# Patient Record
Sex: Female | Born: 1960 | Race: White | Hispanic: No | Marital: Married | Smoking: Never smoker
Health system: Southern US, Community
[De-identification: ages and names within clinical notes are randomized; demographics above are authoritative.]

## PROBLEM LIST (undated history)

## (undated) DIAGNOSIS — J45909 Unspecified asthma, uncomplicated: Secondary | ICD-10-CM

## (undated) DIAGNOSIS — R251 Tremor, unspecified: Secondary | ICD-10-CM

## (undated) DIAGNOSIS — L570 Actinic keratosis: Secondary | ICD-10-CM

## (undated) HISTORY — PX: BILATERAL OOPHORECTOMY: SHX1221

## (undated) HISTORY — DX: Unspecified asthma, uncomplicated: J45.909

## (undated) HISTORY — PX: OOPHORECTOMY: SHX86

## (undated) HISTORY — DX: Actinic keratosis: L57.0

---

## 1995-11-04 DIAGNOSIS — D239 Other benign neoplasm of skin, unspecified: Secondary | ICD-10-CM

## 1995-11-04 HISTORY — DX: Other benign neoplasm of skin, unspecified: D23.9

## 1999-11-29 ENCOUNTER — Other Ambulatory Visit: Admission: RE | Admit: 1999-11-29 | Discharge: 1999-11-29 | Payer: Self-pay | Admitting: Obstetrics and Gynecology

## 2001-02-25 ENCOUNTER — Other Ambulatory Visit: Admission: RE | Admit: 2001-02-25 | Discharge: 2001-02-25 | Payer: Self-pay | Admitting: Obstetrics and Gynecology

## 2002-03-18 ENCOUNTER — Other Ambulatory Visit: Admission: RE | Admit: 2002-03-18 | Discharge: 2002-03-18 | Payer: Self-pay | Admitting: Obstetrics and Gynecology

## 2003-03-21 ENCOUNTER — Other Ambulatory Visit: Admission: RE | Admit: 2003-03-21 | Discharge: 2003-03-21 | Payer: Self-pay | Admitting: Obstetrics and Gynecology

## 2004-11-05 ENCOUNTER — Ambulatory Visit: Payer: Self-pay | Admitting: Unknown Physician Specialty

## 2004-12-26 ENCOUNTER — Ambulatory Visit: Payer: Self-pay

## 2005-07-23 ENCOUNTER — Ambulatory Visit: Payer: Self-pay | Admitting: Obstetrics and Gynecology

## 2005-12-30 HISTORY — PX: ABDOMINAL HYSTERECTOMY: SHX81

## 2008-12-30 LAB — HM COLONOSCOPY

## 2009-05-31 ENCOUNTER — Ambulatory Visit: Payer: Self-pay | Admitting: Unknown Physician Specialty

## 2011-12-31 LAB — HM PAP SMEAR

## 2012-08-10 ENCOUNTER — Ambulatory Visit: Payer: Self-pay | Admitting: Internal Medicine

## 2012-08-10 DIAGNOSIS — Z0289 Encounter for other administrative examinations: Secondary | ICD-10-CM

## 2012-09-16 ENCOUNTER — Encounter: Payer: Self-pay | Admitting: Internal Medicine

## 2012-09-16 ENCOUNTER — Ambulatory Visit (INDEPENDENT_AMBULATORY_CARE_PROVIDER_SITE_OTHER): Payer: BC Managed Care – PPO | Admitting: Internal Medicine

## 2012-09-16 VITALS — BP 120/64 | HR 68 | Temp 98.4°F | Resp 16 | Ht 69.5 in | Wt 146.0 lb

## 2012-09-16 DIAGNOSIS — G4700 Insomnia, unspecified: Secondary | ICD-10-CM

## 2012-09-16 DIAGNOSIS — M25551 Pain in right hip: Secondary | ICD-10-CM

## 2012-09-16 DIAGNOSIS — J45909 Unspecified asthma, uncomplicated: Secondary | ICD-10-CM

## 2012-09-16 DIAGNOSIS — M25559 Pain in unspecified hip: Secondary | ICD-10-CM

## 2012-09-16 NOTE — Patient Instructions (Addendum)
I will let you know when I received Dr. Marily Lente records

## 2012-09-16 NOTE — Progress Notes (Signed)
Patient ID: Carol Harvey, female   DOB: 22-Sep-1961, 51 y.o.   MRN: [id number] Patient Active Problem List  Diagnosis  . Insomnia  . Asthma  . Hip pain, right    Subjective:  CC:   Chief Complaint  Patient presents with  . Establish Care    HPI:   Carol Harvey is a 51 y.o. female who presents as a new patient to establish primary care with the chief complaint of  Right hip pain.   Exercise induced injury which occurring from a streuous  sprinting exercise.  Her pain has been aggravated by getting in and out of a low riding sports car.  Prior orthopedic evaluation Drenda Freeze Alucio, [facility]) has diagnosed a torn labrum with bone spur. He has referred her to [facility] for a  for a  right hip arthroscopy.  2) Weight problem.  She has been working out with a trainer for the last 3 years and is dissatisfied with physical shape, feels she has lost muscle tone and has become flabby since her hysterectomy.  She has a well rounded diet and has increased water intake  and cut back on sweet tea and diet coke.   3)  Insomnia  Managed by Dr. Alanson Aly  who has tried multiple medications ,  Now managed with fluvoxamine .    Past Medical History  Diagnosis Date  . Allergic rhinitis   . Asthma     Past Surgical History  Procedure Date  . Abdominal hysterectomy 2007    for dysfunctional uterine bleeding   . Bilateral oophorectomy     Family History  Problem Relation Age of Onset  . Cancer Mother     colon  . Hyperlipidemia Father   . Hypertension Father     History   Social History  . Marital Status: Single    Spouse Name: N/A    Number of Children: N/A  . Years of Education: N/A   Occupational History  . Not on file.   Social History Main Topics  . Smoking status: Never Smoker   . Smokeless tobacco: Never Used  . Alcohol Use: No  . Drug Use: No  . Sexually Active: Not on file   Other Topics Concern  . Not on file   Social History Narrative  . No  narrative on file         @ALLHX @    Review of Systems:   The remainder of the review of systems was negative except those addressed in the HPI.       Objective:  BP 120/64  Pulse 68  Temp 98.4 F (36.9 C) (Oral)  Resp 16  Ht 5' 9.5" (1.765 m)  Wt 146 lb (66.225 kg)  BMI 21.25 kg/m2  General appearance: alert, cooperative and appears stated age Ears: normal TM's and external ear canals both ears Throat: lips, mucosa, and tongue normal; teeth and gums normal Neck: no adenopathy, no carotid bruit, supple, symmetrical, trachea midline and thyroid not enlarged, symmetric, no tenderness/mass/nodules Back: symmetric, no curvature. ROM normal. No CVA tenderness. Lungs: clear to auscultation bilaterally Heart: regular rate and rhythm, S1, S2 normal, no murmur, click, rub or gallop Abdomen: soft, non-tender; bowel sounds normal; no masses,  no organomegaly Pulses: 2+ and symmetric Skin: Skin color, texture, turgor normal. No rashes or lesions Lymph nodes: Cervical, supraclavicular, and axillary nodes normal.  Assessment and Plan:  Hip pain, right Awaiting arthroscopic surgery for torn labrum and bone spur. Her pain is tolerable without  narcotics if she avoids aggressive workouts.  Asthma With rare exacerbations.  Continue antihistamins and leuokotrine inhibitor.   Insomnia Managed by Dr. Jennelle Human with fluvoxamine.,  Patient averages 6 to 8 hours per night and is functioning well during the day despite episodes of early morning waking which she manages by  Working in her home office until sleepy again.    Updated Medication List Outpatient Encounter Prescriptions as of 09/16/2012  Medication Sig Dispense Refill  . fexofenadine (ALLEGRA) 180 MG tablet Take 180 mg by mouth daily.      . montelukast (SINGULAIR) 10 MG tablet Take 10 mg by mouth at bedtime.         Orders Placed This Encounter  Procedures  . HM MAMMOGRAPHY  . HM PAP SMEAR  . HM COLONOSCOPY    No  Follow-up on file.

## 2012-09-18 ENCOUNTER — Encounter: Payer: Self-pay | Admitting: Internal Medicine

## 2012-09-18 DIAGNOSIS — J45909 Unspecified asthma, uncomplicated: Secondary | ICD-10-CM | POA: Insufficient documentation

## 2012-09-18 DIAGNOSIS — M25551 Pain in right hip: Secondary | ICD-10-CM | POA: Insufficient documentation

## 2012-09-18 NOTE — Assessment & Plan Note (Addendum)
Managed by Dr. Jennelle Human with fluvoxamine.,  Patient averages 6 to 8 hours per night and is functioning well during the day despite episodes of early morning waking which she manages by  Working in her home office until sleepy again.

## 2012-09-18 NOTE — Assessment & Plan Note (Signed)
Awaiting arthroscopic surgery for torn labrum and bone spur. Her pain is tolerable without narcotics if she avoids aggressive workouts.

## 2012-09-18 NOTE — Assessment & Plan Note (Signed)
With rare exacerbations.  Continue antihistamins and leuokotrine inhibitor.

## 2012-12-19 ENCOUNTER — Encounter (HOSPITAL_COMMUNITY): Payer: Self-pay | Admitting: Emergency Medicine

## 2012-12-19 ENCOUNTER — Emergency Department (HOSPITAL_COMMUNITY): Payer: BC Managed Care – PPO

## 2012-12-19 ENCOUNTER — Emergency Department (HOSPITAL_COMMUNITY)
Admission: EM | Admit: 2012-12-19 | Discharge: 2012-12-19 | Disposition: A | Discharge status: Home / Self Care | Payer: BC Managed Care – PPO | Attending: Emergency Medicine | Admitting: Emergency Medicine

## 2012-12-19 DIAGNOSIS — M79609 Pain in unspecified limb: Secondary | ICD-10-CM | POA: Insufficient documentation

## 2012-12-19 DIAGNOSIS — Z79899 Other long term (current) drug therapy: Secondary | ICD-10-CM | POA: Insufficient documentation

## 2012-12-19 DIAGNOSIS — M79676 Pain in unspecified toe(s): Secondary | ICD-10-CM

## 2012-12-19 DIAGNOSIS — J45909 Unspecified asthma, uncomplicated: Secondary | ICD-10-CM | POA: Insufficient documentation

## 2012-12-19 DIAGNOSIS — L0390 Cellulitis, unspecified: Secondary | ICD-10-CM

## 2012-12-19 DIAGNOSIS — J309 Allergic rhinitis, unspecified: Secondary | ICD-10-CM | POA: Insufficient documentation

## 2012-12-19 DIAGNOSIS — L03039 Cellulitis of unspecified toe: Secondary | ICD-10-CM | POA: Insufficient documentation

## 2012-12-19 DIAGNOSIS — L02619 Cutaneous abscess of unspecified foot: Secondary | ICD-10-CM | POA: Insufficient documentation

## 2012-12-19 MED ORDER — CEPHALEXIN 500 MG PO CAPS: 500.0000 mg | ORAL_CAPSULE | Freq: Four times a day (QID) | ORAL | Status: DC

## 2012-12-19 MED ORDER — CEPHALEXIN 500 MG PO CAPS
500.0000 mg | ORAL_CAPSULE | Freq: Once | ORAL | Status: AC
  Administered 2012-12-19: 500 mg via ORAL
  Filled 2012-12-19: qty 1

## 2012-12-19 NOTE — ED Notes (Signed)
Patient given discharge instructions, information, prescriptions, and diet order. Patient states that they adequately understand discharge information given and to return to ED if symptoms return or worsen.     

## 2012-12-19 NOTE — ED Provider Notes (Signed)
History     CSN: [id number]  Arrival date & time 12/19/12  9147   First MD Initiated Contact with Patient 12/19/12 2007      Chief Complaint  Patient presents with  . Toe Pain    (Consider location/radiation/quality/duration/timing/severity/associated sxs/prior treatment) HPI Comments: Patient presents with a chief complaint of right great toe pain.  She has also noticed mild erythema, warmth, and swelling of the toe.  Pain has been present for the past two days and is gradually worsening.    She had surgery on her toe with screws and plates performed by Dr. Lestine Box on 10/30/12.  She has not had any post op complications.  She is currently going to physical therapy.  She denies fever or chills.  No change in ROM of the toe.  She has been taking Ibuprofen and Tylenol for the pain, which helps somewhat.   The history is provided by the patient.    Past Medical History  Diagnosis Date  . Allergic rhinitis   . Asthma     Past Surgical History  Procedure Date  . Abdominal hysterectomy 2007    for dysfunctional uterine bleeding   . Bilateral oophorectomy     Family History  Problem Relation Age of Onset  . Cancer Mother     colon  . Hyperlipidemia Father   . Hypertension Father     History  Substance Use Topics  . Smoking status: Never Smoker   . Smokeless tobacco: Never Used  . Alcohol Use: No    OB History    Grav Para Term Preterm Abortions TAB SAB Ect Mult Living                  Review of Systems  Constitutional: Negative for fever and chills.  Musculoskeletal:       Right great toe pain  Skin: Positive for color change.  All other systems reviewed and are negative.    Allergies  Codeine and Minocycline  Home Medications   Current Outpatient Rx  Name  Route  Sig  Dispense  Refill  . ACETAMINOPHEN 500 MG PO TABS   Oral   Take 1,000 mg by mouth every 6 (six) hours as needed. pain         . FEXOFENADINE HCL 180 MG PO TABS   Oral   Take 180 mg  by mouth at bedtime.          . IBUPROFEN 200 MG PO TABS   Oral   Take 800 mg by mouth every 6 (six) hours as needed. pain         . MONTELUKAST SODIUM 10 MG PO TABS   Oral   Take 10 mg by mouth at bedtime.         . ADULT MULTIVITAMIN W/MINERALS CH   Oral   Take 1 tablet by mouth.         Marland Kitchen FISH OIL CONCENTRATE PO   Oral   Take 1 capsule by mouth daily.           BP 113/70  Pulse 75  Temp 98.4 F (36.9 C) (Oral)  Resp 14  Ht 5\' 10"  (1.778 m)  Wt 140 lb (63.504 kg)  BMI 20.09 kg/m2  SpO2 100%  Physical Exam  Nursing note and vitals reviewed. Constitutional: She appears well-developed and well-nourished. No distress.  HENT:  Head: Normocephalic and atraumatic.  Mouth/Throat: Oropharynx is clear and moist.  Cardiovascular: Normal rate, regular rhythm and normal heart  sounds.   Pulses:      Dorsalis pedis pulses are 2+ on the right side, and 2+ on the left side.  Pulmonary/Chest: Effort normal and breath sounds normal.  Musculoskeletal:       Full ROM of the MTP of the right great toe  Neurological: She is alert. No sensory deficit.  Skin: She is not diaphoretic.       Mild erythema, swelling, and warmth of the dorsal aspect of the right great toe  Psychiatric: She has a normal mood and affect.    ED Course  Procedures (including critical care time)  Labs Reviewed - No data to display Dg Foot Complete Right  12/19/2012  *RADIOLOGY REPORT*  Clinical Data: Toe pain, orthopedic surgery to the great toe 10/30/2012  RIGHT FOOT COMPLETE - 3+ VIEW  Comparison: None.  Findings: Side plate and screw fixation of comminuted proximal phalangeal fractures of the great toe noted.  No evidence for hardware failure.  Fracture lines remain visible.  Remainder of the osseous structures are unremarkable. Minimal associated callus formation.  IMPRESSION: Fracture lines remain visible at the site of previous hardware fixation of the proximal phalanx right great toe.   Original  Report Authenticated By: Christiana Pellant, M.D.      No diagnosis found.    MDM  Xray showing no evidence of hardware failure.  Slight erythema and warmth of the great toe.  Patient is afebrile with full ROM of the MTP.  Patient therefore treated for Cellulitis with antibiotic.  Return precautions discussed with the patient.        Pascal Lux [place], PA-C 12/21/12 1241

## 2012-12-19 NOTE — ED Notes (Signed)
Pt present's to the ED with a complaint of toe pain .  Pt recently had orthopedic surgery to the right big toe and has now noticed a bump on the medial side of the toe.  Patient states she has 3 out of 10 pain.  Pt states she is on her pain management regiment at this time.  Pt  States [facility] orthopedic on call doctor told her to come in.

## 2012-12-21 NOTE — ED Provider Notes (Signed)
Medical screening examination/treatment/procedure(s) were performed by non-physician practitioner and as supervising physician I was immediately available for consultation/collaboration.  Geoffery Lyons, MD 12/21/12 2240

## 2013-02-13 ENCOUNTER — Other Ambulatory Visit: Payer: Self-pay

## 2013-04-23 DIAGNOSIS — N951 Menopausal and female climacteric states: Secondary | ICD-10-CM | POA: Insufficient documentation

## 2013-11-04 ENCOUNTER — Other Ambulatory Visit: Payer: Self-pay

## 2014-10-14 ENCOUNTER — Other Ambulatory Visit: Payer: Self-pay

## 2015-01-16 ENCOUNTER — Ambulatory Visit: Payer: Self-pay | Admitting: Unknown Physician Specialty

## 2015-01-17 DIAGNOSIS — K635 Polyp of colon: Secondary | ICD-10-CM | POA: Insufficient documentation

## 2015-04-24 LAB — SURGICAL PATHOLOGY

## 2016-08-28 ENCOUNTER — Other Ambulatory Visit: Payer: Self-pay | Admitting: Family Medicine

## 2016-08-28 DIAGNOSIS — Z1231 Encounter for screening mammogram for malignant neoplasm of breast: Secondary | ICD-10-CM

## 2016-09-10 ENCOUNTER — Ambulatory Visit: Payer: Self-pay

## 2016-09-12 ENCOUNTER — Ambulatory Visit
Admission: RE | Admit: 2016-09-12 | Discharge: 2016-09-12 | Disposition: A | Discharge status: Home / Self Care | Payer: 59 | Source: Ambulatory Visit | Attending: Family Medicine | Admitting: Family Medicine

## 2016-09-12 DIAGNOSIS — Z1231 Encounter for screening mammogram for malignant neoplasm of breast: Secondary | ICD-10-CM | POA: Insufficient documentation

## 2016-09-13 ENCOUNTER — Other Ambulatory Visit: Payer: Self-pay | Admitting: *Deleted

## 2016-09-13 ENCOUNTER — Inpatient Hospital Stay
Admission: RE | Admit: 2016-09-13 | Discharge: 2016-09-13 | Disposition: A | Discharge status: Home / Self Care | Payer: Self-pay | Source: Ambulatory Visit | Attending: *Deleted | Admitting: *Deleted

## 2016-09-13 DIAGNOSIS — Z9289 Personal history of other medical treatment: Secondary | ICD-10-CM

## 2016-09-16 ENCOUNTER — Ambulatory Visit
Admission: RE | Admit: 2016-09-16 | Discharge: 2016-09-16 | Disposition: A | Discharge status: Home / Self Care | Payer: 59 | Source: Ambulatory Visit | Attending: Unknown Physician Specialty | Admitting: Unknown Physician Specialty

## 2016-09-16 ENCOUNTER — Other Ambulatory Visit: Payer: Self-pay | Admitting: Unknown Physician Specialty

## 2016-09-16 DIAGNOSIS — R05 Cough: Secondary | ICD-10-CM | POA: Insufficient documentation

## 2016-09-16 DIAGNOSIS — R059 Cough, unspecified: Secondary | ICD-10-CM

## 2017-02-14 ENCOUNTER — Emergency Department
Admission: EM | Admit: 2017-02-14 | Discharge: 2017-02-15 | Disposition: A | Discharge status: Home / Self Care | Payer: 59 | Attending: Emergency Medicine | Admitting: Emergency Medicine

## 2017-02-14 ENCOUNTER — Emergency Department: Payer: 59

## 2017-02-14 DIAGNOSIS — J45909 Unspecified asthma, uncomplicated: Secondary | ICD-10-CM | POA: Insufficient documentation

## 2017-02-14 DIAGNOSIS — R0989 Other specified symptoms and signs involving the circulatory and respiratory systems: Secondary | ICD-10-CM | POA: Insufficient documentation

## 2017-02-14 DIAGNOSIS — Z79899 Other long term (current) drug therapy: Secondary | ICD-10-CM | POA: Insufficient documentation

## 2017-02-14 DIAGNOSIS — R55 Syncope and collapse: Secondary | ICD-10-CM | POA: Diagnosis present

## 2017-02-14 DIAGNOSIS — T17308A Unspecified foreign body in larynx causing other injury, initial encounter: Secondary | ICD-10-CM

## 2017-02-14 HISTORY — DX: Tremor, unspecified: R25.1

## 2017-02-14 NOTE — ED Notes (Signed)
Patient reports she receives Botox injections in her neck - last inject 2  Months ago. PT reports she has had problems swallowing recently, she believes due to Botox and benign essential tremor

## 2017-02-14 NOTE — ED Triage Notes (Signed)
Patient choked on piece of steak approx 20 minutes ago. Pt lost consciousness, fell to floor. Pt c/o epigastric pain

## 2017-02-14 NOTE — ED Provider Notes (Signed)
[facility] Emergency Department Provider Note   First MD Initiated Contact with Patient 02/14/17 2315     (approximate)  I have reviewed the triage vital signs and the nursing notes.   HISTORY  Chief Complaint Choking and Loss of Consciousness   HPI Carol Harvey is a 56 y.o. female presents via EMS after choking while eating steak tonight. Per EMS the patient lost consciousness and Heimlich maneuver was performed by her friend. Per the patient's friend at bedside patient was ashen before she performed the Heimlich maneuver. Following doing so patient regained consciousness and appropriate skin color. Patient admits to pain at xiphoid process of the sternum. Of possible important patient states that she receives Botox injections in her neck secondary to benign essential tremors with last treatment 2 months ago. Patient states for approximately one month she is noted that she's had to "clear her throat" repetitively.   Past Medical History:  Diagnosis Date  . Allergic rhinitis   . Asthma   . Tremor     Patient Active Problem List   Diagnosis Date Noted  . Hip pain, right 09/18/2012  . Asthma   . Insomnia 09/16/2012    Past Surgical History:  Procedure Laterality Date  . ABDOMINAL HYSTERECTOMY  2007   for dysfunctional uterine bleeding   . BILATERAL OOPHORECTOMY      Prior to Admission medications   Medication Sig Start Date End Date Taking? Authorizing Provider  acetaminophen (TYLENOL) 500 MG tablet Take 1,000 mg by mouth every 6 (six) hours as needed. pain    Historical Provider, MD  cephALEXin (KEFLEX) 500 MG capsule Take 1 capsule (500 mg total) by mouth 4 (four) times daily. 12/19/12   Heather Laisure, PA-C  fexofenadine (ALLEGRA) 180 MG tablet Take 180 mg by mouth at bedtime.     Historical Provider, MD  ibuprofen (ADVIL,MOTRIN) 200 MG tablet Take 800 mg by mouth every 6 (six) hours as needed. pain    Historical Provider, MD    montelukast (SINGULAIR) 10 MG tablet Take 10 mg by mouth at bedtime.    Historical Provider, MD  Multiple Vitamin (MULTIVITAMIN WITH MINERALS) TABS Take 1 tablet by mouth.    Historical Provider, MD  Omega-3 Fatty Acids (FISH OIL CONCENTRATE PO) Take 1 capsule by mouth daily.    Historical Provider, MD    Allergies Codeine; Minocycline; and Wellbutrin [bupropion]  Family History  Problem Relation Age of Onset  . Cancer Mother     colon  . Hyperlipidemia Father   . Hypertension Father     Social History Social History  Substance Use Topics  . Smoking status: Never Smoker  . Smokeless tobacco: Never Used  . Alcohol use No     Comment: occasional wine    Review of Systems Constitutional: No fever/chills Eyes: No visual changes. ENT: No sore throat. Cardiovascular: Denies chest pain. Respiratory: Denies shortness of breath. Gastrointestinal: No abdominal pain.  No nausea, no vomiting.  No diarrhea.  No constipation. Genitourinary: Negative for dysuria. Musculoskeletal: Negative for back pain. Skin: Negative for rash. Neurological: Negative for headaches, focal weakness or numbness.  10-point ROS otherwise negative.  ____________________________________________   PHYSICAL EXAM:  VITAL SIGNS: ED Triage Vitals  Enc Vitals Group     BP 02/14/17 2309 (!) 138/95     Pulse Rate 02/14/17 2309 87     Resp 02/14/17 2309 (!) 21     Temp 02/14/17 2309 97.6 F (36.4 C)     Temp  Source 02/14/17 2309 Oral     SpO2 02/14/17 2307 96 %     Weight 02/14/17 2310 142 lb (64.4 kg)     Height 02/14/17 2310 5\' 11"  (1.803 m)     Head Circumference --      Peak Flow --      Pain Score 02/14/17 2310 5     Pain Loc --      Pain Edu? --      Excl. in [place]? --     Constitutional: Alert and oriented. Well appearing and in no acute distress. Eyes: Conjunctivae are normal. PERRL. EOMI. Head: Atraumatic. Ears:  Healthy appearing ear canals and TMs bilaterally Nose: No  congestion/rhinnorhea. Mouth/Throat: Mucous membranes are moist.  Oropharynx non-erythematous. Neck: No stridor.  No meningeal signs.   Cardiovascular: Normal rate, regular rhythm. Good peripheral circulation. Grossly normal heart sounds. Respiratory: Normal respiratory effort.  No retractions. Lungs CTAB. Gastrointestinal: Soft and nontender. No distention.  Musculoskeletal: No lower extremity tenderness nor edema. No gross deformities of extremities. Neurologic:  Normal speech and language. No gross focal neurologic deficits are appreciated.  Skin:  Skin is warm, dry and intact. No rash noted. Psychiatric: Mood and affect are normal. Speech and behavior are normal.  ____________________________________________   LABS (all labs ordered are listed, but only abnormal results are displayed)  Labs Reviewed - No data to display ____________________________________________     RADIOLOGY I, [place], personally viewed and evaluated these images (plain radiographs) as part of my medical decision making, as well as reviewing the written report by the radiologist.  Dg Chest 2 View  Result Date: 02/14/2017 CLINICAL DATA:  Status post syncope after choking on steak. Lower sternal pain. Initial encounter. EXAM: CHEST  2 VIEW COMPARISON:  Chest radiograph performed 09/16/2016 FINDINGS: The lungs are well-aerated and clear. There is no evidence of focal opacification, pleural effusion or pneumothorax. The heart is normal in size; the mediastinal contour is within normal limits. No acute osseous abnormalities are seen. The sternum appears grossly intact. IMPRESSION: No acute cardiopulmonary process seen. Electronically Signed   By: Garald Balding M.D.   On: 02/14/2017 23:53    ____________________________________________  Procedures      INITIAL IMPRESSION / ASSESSMENT AND PLAN / ED COURSE  Pertinent labs & imaging results that were available during my care of the patient were  reviewed by me and considered in my medical decision making (see chart for details).  Patient with no complaints at this time no difficulty swallowing or breathing.      ____________________________________________  FINAL CLINICAL IMPRESSION(S) / ED DIAGNOSES  Final diagnoses:  Choking, initial encounter     MEDICATIONS GIVEN DURING THIS VISIT:  Medications - No data to display   NEW OUTPATIENT MEDICATIONS STARTED DURING THIS VISIT:  New Prescriptions   No medications on file    Modified Medications   No medications on file    Discontinued Medications   No medications on file     Note:  This document was prepared using Dragon voice recognition software and may include unintentional dictation errors.    Gregor Hams, MD 02/15/17 [phone]

## 2017-02-14 NOTE — ED Notes (Signed)
MD Brown at bedside.

## 2017-08-22 ENCOUNTER — Other Ambulatory Visit: Payer: Self-pay | Admitting: Obstetrics and Gynecology

## 2017-08-22 DIAGNOSIS — Z1231 Encounter for screening mammogram for malignant neoplasm of breast: Secondary | ICD-10-CM

## 2017-08-26 DIAGNOSIS — Z820 Family history of epilepsy and other diseases of the nervous system: Secondary | ICD-10-CM | POA: Insufficient documentation

## 2017-08-26 DIAGNOSIS — M25562 Pain in left knee: Secondary | ICD-10-CM | POA: Insufficient documentation

## 2017-09-16 DIAGNOSIS — M1712 Unilateral primary osteoarthritis, left knee: Secondary | ICD-10-CM | POA: Insufficient documentation

## 2017-09-18 ENCOUNTER — Ambulatory Visit
Admission: RE | Admit: 2017-09-18 | Discharge: 2017-09-18 | Disposition: A | Discharge status: Home / Self Care | Payer: 59 | Source: Ambulatory Visit | Attending: Obstetrics and Gynecology | Admitting: Obstetrics and Gynecology

## 2017-09-18 DIAGNOSIS — Z1231 Encounter for screening mammogram for malignant neoplasm of breast: Secondary | ICD-10-CM | POA: Insufficient documentation

## 2018-09-14 ENCOUNTER — Other Ambulatory Visit: Payer: Self-pay | Admitting: Internal Medicine

## 2018-09-14 DIAGNOSIS — Z1231 Encounter for screening mammogram for malignant neoplasm of breast: Secondary | ICD-10-CM

## 2018-10-27 ENCOUNTER — Ambulatory Visit
Admission: RE | Admit: 2018-10-27 | Discharge: 2018-10-27 | Disposition: A | Discharge status: Home / Self Care | Payer: PRIVATE HEALTH INSURANCE | Source: Ambulatory Visit | Attending: Internal Medicine | Admitting: Internal Medicine

## 2018-10-27 DIAGNOSIS — Z1231 Encounter for screening mammogram for malignant neoplasm of breast: Secondary | ICD-10-CM | POA: Diagnosis present

## 2018-11-19 ENCOUNTER — Other Ambulatory Visit: Payer: Self-pay

## 2018-11-19 MED ORDER — DIAZEPAM 10 MG PO TABS: 10.0000 mg | ORAL_TABLET | Freq: Every day | ORAL | 0 refills | Status: DC

## 2018-12-23 ENCOUNTER — Encounter: Payer: Self-pay | Admitting: Emergency Medicine

## 2018-12-23 DIAGNOSIS — F411 Generalized anxiety disorder: Secondary | ICD-10-CM | POA: Insufficient documentation

## 2018-12-23 DIAGNOSIS — F40248 Other situational type phobia: Secondary | ICD-10-CM

## 2018-12-25 ENCOUNTER — Telehealth: Payer: Self-pay | Admitting: Psychiatry

## 2018-12-25 NOTE — Telephone Encounter (Signed)
Carol Harvey called saying she is having a reaction to the Valium she is taking. She wants to know the steps she needs to take to tapering off.

## 2018-12-25 NOTE — Telephone Encounter (Signed)
Left voicemail for her to call back with more details on reaction of medication

## 2018-12-25 NOTE — Telephone Encounter (Signed)
C/o ears ringing, tremor in neck is worse. Feels worse, something is off with her valium been taking past 6 weeks. Prior to that she was taking lorazepam.   Would love to taper off and try more natural things, says she's been on a sleep aid so long and would like to try this route.   Please advise

## 2018-12-25 NOTE — Telephone Encounter (Signed)
Symptoms describes such as ringing in the ears and tremor and neck pain are consistent with the possibility of an extended period of benzodiazepine withdrawal.  Patient was not available to left message about this information and suggested prior to going off that through the weekend she take 15 mg of diazepam instead of 10.  If this solves the problem then it confirms the above interpretation of her symptoms and may guide treatment decisions going forward.  Lynder Parents, MD, DFAPA

## 2018-12-28 ENCOUNTER — Telehealth: Payer: Self-pay | Admitting: Psychiatry

## 2018-12-28 NOTE — Telephone Encounter (Signed)
RTC>  4 days on 1/2 valium 5mg  tablet.  Keeping notes.  Started Nov 21. Off flurazepam since tbutshothen.  Been on flurazepam for a long time then valium before thanksgiving.  Noticed ringing in ears and oversleeping in am and hard to wake up.  First panic in 25 years, emotional and shakey.  Hard to get flurazepam.  Nothing else for sleep except Valium.  Wants to try going off of it.  Don't want to do anything else except wean off of it.  We discussed the short-term risks associated with benzodiazepines including sedation and increased fall risk among others.  Discussed long-term side effect risk including dependence, potential withdrawal symptoms, and the potential eventual dose-related risk of dementia.  No unusual stress.  Overall in a good place.  Go slow at least a month before reducing the valium again.  She agrees.Disc withdrawal can be prolonged.  Lynder Parents, MD, DFAPA

## 2019-01-01 ENCOUNTER — Other Ambulatory Visit: Payer: Self-pay | Admitting: Psychiatry

## 2019-01-01 NOTE — Telephone Encounter (Signed)
Please escribe if you haven't already

## 2019-01-18 ENCOUNTER — Ambulatory Visit: Payer: Self-pay | Admitting: Psychiatry

## 2019-02-10 ENCOUNTER — Ambulatory Visit: Payer: Self-pay | Admitting: Psychiatry

## 2019-03-09 ENCOUNTER — Ambulatory Visit: Payer: Self-pay | Admitting: Psychiatry

## 2019-06-23 ENCOUNTER — Ambulatory Visit: Payer: PRIVATE HEALTH INSURANCE | Admitting: Orthopaedic Surgery

## 2019-07-26 ENCOUNTER — Ambulatory Visit: Payer: 59 | Admitting: Psychiatry

## 2019-08-11 ENCOUNTER — Encounter: Payer: Self-pay | Admitting: Psychiatry

## 2019-08-11 ENCOUNTER — Ambulatory Visit (INDEPENDENT_AMBULATORY_CARE_PROVIDER_SITE_OTHER): Payer: PRIVATE HEALTH INSURANCE | Admitting: Psychiatry

## 2019-08-11 ENCOUNTER — Other Ambulatory Visit: Payer: Self-pay

## 2019-08-11 DIAGNOSIS — F411 Generalized anxiety disorder: Secondary | ICD-10-CM | POA: Diagnosis not present

## 2019-08-11 DIAGNOSIS — F5105 Insomnia due to other mental disorder: Secondary | ICD-10-CM | POA: Diagnosis not present

## 2019-08-11 NOTE — Progress Notes (Signed)
Carol Harvey [id number] [birth year] 58 y.o.  Virtual Visit via Telephone Note  I connected with pt on 08/11/19 at 11:45 AM EDT by telephone and verified that I am speaking with the correct person using two identifiers.   I discussed the limitations, risks, security and privacy concerns of performing an evaluation and management service by telephone and the availability of in person appointments. I also discussed with the patient that there may be a patient responsible charge related to this service. The patient expressed understanding and agreed to proceed.   I discussed the assessment and treatment plan with the patient. The patient was provided an opportunity to ask questions and all were answered. The patient agreed with the plan and demonstrated an understanding of the instructions.   The patient was advised to call back or seek an in-person evaluation if the symptoms worsen or if the condition fails to improve as anticipated.  I provided 15 minutes of non-face-to-face time during this encounter.  The patient was located at home.  The provider was located at [place].   Purnell Shoemaker, MD   Subjective:   Patient ID:  Carol Harvey is a 58 y.o. (DOB Oct 28, 1961) female.  Chief Complaint:  Chief Complaint  Patient presents with  . Follow-up    Medication Management  . Anxiety    Medication Management  . Sleeping Problem    HPI Carol Harvey presents for follow-up of chronic insomnia and some degree of anxiety.  Last seen July 27, 2018.  She was on flurazepam 30 mg nightly which she had taken for a number of years.  Called in December stating she could no longer get flurazepam because of availability issues.  She switched to Valium which did help her sleep somewhat but she had some side effects like ringing in the ears that she did not like.  She decided she wanted to gradually slowly taper herself off of it and try to do without  benzodiazepine for sleep.  This is been unsuccessful in the past.  Tapered off diazepam from November to March but was difficult.   Doesn't want to take BZ again bc so hard to get off.  Sleep good April to June.  Since then a little worse with occasional melatonin and it seems to knock her out when needed.     Last 6 weeks or so waking up with anxiety.  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  I think I'm doing ok.  No increased alcohol.  She thinks she's overall good.  No full panic but wave of anxiety lasting 30 mins.    Past Psychiatric Medication Trials: Flurazepam 45, temazepam, clonazepam, lorazepam, diazepam,  alprazolam, doxepin, Lunesta, Benadryl, Ambien, clonidine, quetiapine, Belsomra, Xanax XR, Pristiq, sertraline, trazodone no response, Paxil panic, Wellbutrin no response, fluoxetine, hydroxyzine no response  Review of Systems:  Review of Systems  Neurological: Negative for tremors and weakness.    Medications: I have reviewed the patient's current medications.  Current Outpatient Medications  Medication Sig Dispense Refill  . acetaminophen (TYLENOL) 500 MG tablet Take 1,000 mg by mouth every 6 (six) hours as needed. pain    . Calcium Carbonate-Vit D-Min (CALCIUM 1200 PO) Take by mouth.    . estradiol (ESTRACE) 0.5 MG tablet Take by mouth.    . fexofenadine (ALLEGRA) 180 MG tablet Take 180 mg by mouth at bedtime.     . montelukast (SINGULAIR) 10 MG tablet Take 10 mg by mouth  at bedtime.    . Multiple Vitamin (MULTIVITAMIN WITH MINERALS) TABS Take 1 tablet by mouth.    . valACYclovir (VALTREX) 1000 MG tablet Take 500 mg by mouth as needed.      No current facility-administered medications for this visit.     Medication Side Effects: None  Allergies:  Allergies  Allergen Reactions  . Codeine     hallucination  . Minocycline Hives  . Wellbutrin [Bupropion]   . Pristiq [Desvenlafaxine] Rash    Past Medical History:  Diagnosis Date  . Allergic rhinitis    . Asthma   . Tremor     Family History  Problem Relation Age of Onset  . Cancer Mother        colon  . Hyperlipidemia Father   . Hypertension Father   . Breast cancer Neg Hx     Social History   Socioeconomic History  . Marital status: Married    Spouse name: Not on file  . Number of children: Not on file  . Years of education: Not on file  . Highest education level: Not on file  Occupational History  . Not on file  Social Needs  . Financial resource strain: Not on file  . Food insecurity    Worry: Not on file    Inability: Not on file  . Transportation needs    Medical: Not on file    Non-medical: Not on file  Tobacco Use  . Smoking status: Never Smoker  . Smokeless tobacco: Never Used  Substance and Sexual Activity  . Alcohol use: No    Comment: occasional wine  . Drug use: No  . Sexual activity: Not on file  Lifestyle  . Physical activity    Days per week: Not on file    Minutes per session: Not on file  . Stress: Not on file  Relationships  . Social Herbalist on phone: Not on file    Gets together: Not on file    Attends religious service: Not on file    Active member of club or organization: Not on file    Attends meetings of clubs or organizations: Not on file    Relationship status: Not on file  . Intimate partner violence    Fear of current or ex partner: Not on file    Emotionally abused: Not on file    Physically abused: Not on file    Forced sexual activity: Not on file  Other Topics Concern  . Not on file  Social History Narrative  . Not on file    Past Medical History, Surgical history, Social history, and Family history were reviewed and updated as appropriate.   Please see review of systems for further details on the patient's review from today.   Objective:   Physical Exam:  There were no vitals taken for this visit.  Physical Exam Neurological:     Mental Status: She is alert and oriented to person, place, and  time.     Cranial Nerves: No dysarthria.  Psychiatric:        Attention and Perception: Attention normal.        Mood and Affect: Mood is anxious. Mood is not depressed.        Speech: Speech normal.        Behavior: Behavior is cooperative.        Thought Content: Thought content normal. Thought content is not paranoid or delusional. Thought content does not include homicidal  or suicidal ideation. Thought content does not include homicidal or suicidal plan.        Cognition and Memory: Cognition and memory normal.        Judgment: Judgment normal.     Comments: Insight good.       Lab Review:  No results found for: NA, K, CL, CO2, GLUCOSE, BUN, CREATININE, CALCIUM, PROT, ALBUMIN, AST, ALT, ALKPHOS, BILITOT, GFRNONAA, GFRAA  No results found for: WBC, RBC, HGB, HCT, PLT, MCV, MCH, MCHC, RDW, LYMPHSABS, MONOABS, EOSABS, BASOSABS  No results found for: POCLITH, LITHIUM   No results found for: PHENYTOIN, PHENOBARB, VALPROATE, CBMZ   .res Assessment: Plan:    Elliot was seen today for follow-up, anxiety and sleeping problem.  Diagnoses and all orders for this visit:  Generalized anxiety disorder  Insomnia due to mental condition  Insomnia for the most part has resolved after being off benzodiazepines for several months.  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  She agrees with the plan to stay off of medications for now.  Avoid BZ per her request because it was so hard to get off of them.  Options gabapentin. Or some others even trying meds that failed to work before because they may work now that she is off of benzodiazepines for subpleural months  FU prn.  Lynder Parents, MD, DFAPA  Please see After Visit  Summary for patient specific instructions.  No future appointments.  No orders of the defined types were placed in this encounter.     -------------------------------

## 2019-09-02 ENCOUNTER — Other Ambulatory Visit: Payer: Self-pay | Admitting: Psychiatry

## 2019-10-18 DIAGNOSIS — K52832 Lymphocytic colitis: Secondary | ICD-10-CM | POA: Insufficient documentation

## 2019-12-15 ENCOUNTER — Other Ambulatory Visit: Payer: Self-pay

## 2019-12-15 ENCOUNTER — Ambulatory Visit: Payer: PRIVATE HEALTH INSURANCE | Attending: Internal Medicine

## 2019-12-15 DIAGNOSIS — Z20828 Contact with and (suspected) exposure to other viral communicable diseases: Secondary | ICD-10-CM | POA: Insufficient documentation

## 2019-12-15 DIAGNOSIS — Z20822 Contact with and (suspected) exposure to covid-19: Secondary | ICD-10-CM

## 2019-12-17 NOTE — Progress Notes (Signed)
Order(s) created erroneously. Erroneous order ID: [id number]  Order moved by: Brigitte Pulse  Order move date/time: 12/17/2019 1:42 PM  Source Patient: [id number]  Source Contact: 12/15/2019  Destination Patient: [id number]  Destination Contact: 12/15/2019

## 2019-12-17 NOTE — Progress Notes (Signed)
Orders moved to this encounter.

## 2019-12-17 NOTE — Progress Notes (Signed)
Order(s) created erroneously. Erroneous order ID: [id number]  Order moved by: Brigitte Pulse  Order move date/time: 12/17/2019 1:42 PM  Source Patient: [id number]  Source Contact: 12/15/2019  Destination Patient: [id number]  Destination Contact: 12/15/2019

## 2019-12-18 LAB — NOVEL CORONAVIRUS, NAA: SARS-CoV-2, NAA: NOT DETECTED

## 2019-12-27 ENCOUNTER — Ambulatory Visit: Payer: PRIVATE HEALTH INSURANCE | Attending: Internal Medicine

## 2020-03-27 ENCOUNTER — Ambulatory Visit: Payer: Self-pay | Admitting: Podiatry

## 2020-04-05 ENCOUNTER — Ambulatory Visit: Payer: Self-pay

## 2020-04-05 ENCOUNTER — Encounter: Payer: Self-pay | Admitting: Podiatry

## 2020-05-03 ENCOUNTER — Ambulatory Visit (INDEPENDENT_AMBULATORY_CARE_PROVIDER_SITE_OTHER): Payer: Self-pay | Admitting: Podiatry

## 2020-05-03 ENCOUNTER — Encounter: Payer: Self-pay | Admitting: Podiatry

## 2020-05-03 ENCOUNTER — Ambulatory Visit (INDEPENDENT_AMBULATORY_CARE_PROVIDER_SITE_OTHER): Payer: Self-pay

## 2020-05-03 ENCOUNTER — Other Ambulatory Visit: Payer: Self-pay

## 2020-05-03 VITALS — Temp 97.7°F

## 2020-05-03 DIAGNOSIS — M2011 Hallux valgus (acquired), right foot: Secondary | ICD-10-CM

## 2020-05-03 NOTE — Progress Notes (Signed)
Subjective:  Patient ID: Carol Harvey, female    DOB: [birth year],  MRN: [id number] HPI Chief Complaint  Patient presents with  . Foot Pain    patient presents today for "knot" on lateral side of right foot x 6 months.  She reports its starting to become tender to touch and getting a little bigger.  She also c/o bunion,hammertoes right foot.  She denies any pain, but her great toe is curving out and starting to bother her    59 y.o. female presents with the above complaint.   ROS: Denies fever chills nausea vomiting muscle aches pains calf pain back pain chest pain shortness of breath.  Past Medical History:  Diagnosis Date  . Allergic rhinitis   . Asthma   . Tremor    Past Surgical History:  Procedure Laterality Date  . ABDOMINAL HYSTERECTOMY  2007   for dysfunctional uterine bleeding   . BILATERAL OOPHORECTOMY    . OOPHORECTOMY      Current Outpatient Medications:  .  acetaminophen (TYLENOL) 500 MG tablet, Take 1,000 mg by mouth every 6 (six) hours as needed. pain, Disp: , Rfl:  .  albuterol (VENTOLIN HFA) 108 (90 Base) MCG/ACT inhaler, INHALE 2 PUFFS INTO THE LUNGS EVERY 6-8 HOURS AS NEEDED FOR COUGH/WHEEZE, Disp: , Rfl:  .  Calcium Carbonate-Vit D-Min (CALCIUM 1200 PO), Take by mouth., Disp: , Rfl:  .  estradiol (ESTRACE) 0.5 MG tablet, Take by mouth., Disp: , Rfl:  .  fexofenadine (ALLEGRA) 180 MG tablet, Take 180 mg by mouth at bedtime. , Disp: , Rfl:  .  L-Methylfolate 15 MG TABS, TAKE ONE TABLET EVERY DAY, Disp: 90 tablet, Rfl: 3 .  mometasone (ELOCON) 0.1 % cream, APPLY A SMALL AMOUNT TO AFFECTED AREA AS DIRECTED DAILY TO ITCHY BUMPS ON CHEST AS NEEDED FOR FLARES, Disp: , Rfl:  .  Multiple Vitamin (MULTIVITAMIN WITH MINERALS) TABS, Take 1 tablet by mouth., Disp: , Rfl:  .  spironolactone (ALDACTONE) 25 MG tablet, Take 12.5-25 mg by mouth daily as needed., Disp: , Rfl:  .  valACYclovir (VALTREX) 1000 MG tablet, Take 500 mg by mouth as needed. , Disp: , Rfl:    Allergies  Allergen Reactions  . Codeine     hallucination  . Minocycline Hives  . Wellbutrin [Bupropion]   . Pristiq [Desvenlafaxine] Rash   Review of Systems Objective:   Vitals:   05/03/20 0852  Temp: 97.7 F (36.5 C)    General: Well developed, nourished, in no acute distress, alert and oriented x3   Dermatological: Skin is warm, dry and supple bilateral. Nails x 10 are well maintained; remaining integument appears unremarkable at this time. There are no open sores, no preulcerative lesions, no rash or signs of infection present.  Vascular: Dorsalis Pedis artery and Posterior Tibial artery pedal pulses are 2/4 bilateral with immedate capillary fill time. Pedal hair growth present. No varicosities and no lower extremity edema present bilateral.   Neruologic: Grossly intact via light touch bilateral. Vibratory intact via tuning fork bilateral. Protective threshold with Semmes Wienstein monofilament intact to all pedal sites bilateral. Patellar and Achilles deep tendon reflexes 2+ bilateral. No Babinski or clonus noted bilateral.   Musculoskeletal: No gross boney pedal deformities bilateral. No pain, crepitus, or limitation noted with foot and ankle range of motion bilateral. Muscular strength 5/5 in all groups tested bilateral.  She had overlying tenderness to fifth metatarsal base of the right foot with mild hypertrophy of the fifth met base.  There  is a small area of bursitis it is located in this area most likely secondary to shoe gear and hypertrophy of the bone.  Gait: Unassisted, Nonantalgic.    Radiographs:  Radiographs taken today demonstrate mild increase in the first intermetatarsal angle larger increase in the hallux abductus angle and hallux interphalangeal angle.  She has very mild overlap of the second toe on the hallux lateral nail fold as seen on radiograph she also retains internal fixation for PIPJ fusion fourth toe right foot otherwise mild hammertoe  deformities noted #2 #3 of the right foot.  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  Assessment & Plan:   Assessment: Mild hallux valgus deformity hammertoe deformities and bursitis fifth metatarsal base right foot.  Plan: We discussed the possible need for the bursal injections or possible changes in the foot structure as we age.  We discussed appropriate shoe gear stretching exercise ice therapy sugar modifications and the fact that we may need to consider some of this later in life.  We also discussed the possible injection of the bursa fifth met base.  She will follow.     Mauriah Mcmillen T. [place], [place]

## 2020-05-22 NOTE — Progress Notes (Signed)
This encounter was created in error - please disregard.

## 2020-08-16 ENCOUNTER — Ambulatory Visit: Payer: PRIVATE HEALTH INSURANCE | Admitting: Dermatology

## 2020-08-17 ENCOUNTER — Ambulatory Visit: Payer: PRIVATE HEALTH INSURANCE | Admitting: Dermatology

## 2020-08-28 ENCOUNTER — Ambulatory Visit (INDEPENDENT_AMBULATORY_CARE_PROVIDER_SITE_OTHER): Payer: Self-pay | Admitting: Dermatology

## 2020-08-28 ENCOUNTER — Encounter: Payer: Self-pay | Admitting: Dermatology

## 2020-08-28 ENCOUNTER — Other Ambulatory Visit: Payer: Self-pay

## 2020-08-28 DIAGNOSIS — L719 Rosacea, unspecified: Secondary | ICD-10-CM

## 2020-08-28 DIAGNOSIS — L821 Other seborrheic keratosis: Secondary | ICD-10-CM

## 2020-08-28 NOTE — Patient Instructions (Addendum)
Recommend daily broad spectrum sunscreen SPF 30+ to sun-exposed areas, reapply every 2 hours as needed. Call for new or changing lesions.    Instructions for [facility] Medications  One or more of your medications was sent to the [facility] mail order compounding pharmacy. You will receive an email from them and can purchase the medicine through that link. It will then be mailed to your home at the address you confirmed. If for any reason you do not receive an email from them, please check your spam folder. If you still do not find the email, please let us know. [facility] phone number is [phone].   

## 2020-08-28 NOTE — Progress Notes (Signed)
   Follow-Up Visit   Subjective  Carol Harvey is a 59 y.o. female who presents for the following: Area of concern.  Patient presents today for a few areas of concern, Moles on back, area on right hand and area on nose. Patient would like to have these areas evaluated today  The following portions of the chart were reviewed this encounter and updated as appropriate:  Tobacco  Allergies  Meds  Problems  Med Hx  Surg Hx  Fam Hx     Review of Systems:  No other skin or systemic complaints except as noted in HPI or Assessment and Plan.  Objective  Well appearing patient in no apparent distress; mood and affect are within normal limits.  A focused examination was performed including Back, nose and Right hand. Relevant physical exam findings are noted in the Assessment and Plan.  Objective  Mid Tip of Nose: Mid face erythema with telangiectasias +/- scattered inflammatory papules.   Objective  Back, Right Dorsal Hand: Stuck-on, waxy, tan-brown papules and plaques -- Discussed benign etiology and prognosis.    Assessment & Plan  Rosacea Mid Tip of Nose Small pink papules Start [facility] Rosacea Triple cream Advised to use bid then when clear could try to decrease to qhs. Chronic treatment is recommended as rosacea is considered to be a chronic progressive condition.  Seborrheic keratosis (2) Back; Right Dorsal Hand Pt wants to treat, but will wait until after [place] trip coming soon. Benign, observe.  Will RTC for cryotherapy Discussed fees: $60 for 1st and $15 each additional - any # greater than 22 lesions is flat fee of $350.  Return for Schedule SK removal .  I, Donzetta Kohut, CMA, am acting as scribe for Sarina Ser, MD . Documentation: I have reviewed the above documentation for accuracy and completeness, and I agree with the above.  Sarina Ser, MD

## 2020-09-02 ENCOUNTER — Encounter: Payer: Self-pay | Admitting: Dermatology

## 2020-10-09 ENCOUNTER — Ambulatory Visit: Payer: PRIVATE HEALTH INSURANCE | Admitting: Dermatology

## 2020-12-04 ENCOUNTER — Ambulatory Visit: Payer: PRIVATE HEALTH INSURANCE | Admitting: Dermatology

## 2020-12-05 ENCOUNTER — Other Ambulatory Visit: Payer: Self-pay | Admitting: Internal Medicine

## 2020-12-05 DIAGNOSIS — Z1231 Encounter for screening mammogram for malignant neoplasm of breast: Secondary | ICD-10-CM

## 2021-01-03 ENCOUNTER — Ambulatory Visit
Admission: RE | Admit: 2021-01-03 | Discharge: 2021-01-03 | Disposition: A | Discharge status: Home / Self Care | Payer: Self-pay | Source: Ambulatory Visit | Attending: Internal Medicine | Admitting: Internal Medicine

## 2021-01-03 ENCOUNTER — Other Ambulatory Visit: Payer: Self-pay

## 2021-01-03 DIAGNOSIS — Z1231 Encounter for screening mammogram for malignant neoplasm of breast: Secondary | ICD-10-CM | POA: Insufficient documentation

## 2021-03-14 ENCOUNTER — Ambulatory Visit: Payer: Self-pay | Admitting: Dermatology

## 2021-03-15 ENCOUNTER — Ambulatory Visit (INDEPENDENT_AMBULATORY_CARE_PROVIDER_SITE_OTHER): Payer: Self-pay | Admitting: Dermatology

## 2021-03-15 ENCOUNTER — Other Ambulatory Visit: Payer: Self-pay

## 2021-03-15 DIAGNOSIS — L814 Other melanin hyperpigmentation: Secondary | ICD-10-CM

## 2021-03-15 DIAGNOSIS — L821 Other seborrheic keratosis: Secondary | ICD-10-CM

## 2021-03-15 DIAGNOSIS — D229 Melanocytic nevi, unspecified: Secondary | ICD-10-CM

## 2021-03-15 DIAGNOSIS — L111 Transient acantholytic dermatosis [Grover]: Secondary | ICD-10-CM

## 2021-03-15 DIAGNOSIS — L578 Other skin changes due to chronic exposure to nonionizing radiation: Secondary | ICD-10-CM

## 2021-03-15 MED ORDER — TRIAMCINOLONE ACETONIDE 0.1 % EX CREA: 1.0000 "application " | TOPICAL_CREAM | Freq: Two times a day (BID) | CUTANEOUS | 1 refills | Status: DC

## 2021-03-15 NOTE — Progress Notes (Signed)
   Follow-Up Visit   Subjective  Carol Harvey is a 60 y.o. female who presents for the following: Skin Problem (Patient here today with a spot at the back. Patient not sure how long it's been there but while having a facial it was noticed by aesthetician and recommended she have it checked.  ).  She is also bothered by itchy bumps at side.  The following portions of the chart were reviewed this encounter and updated as appropriate:   Tobacco  Allergies  Meds  Problems  Med Hx  Surg Hx  Fam Hx      Review of Systems:  No other skin or systemic complaints except as noted in HPI or Assessment and Plan.  Objective  Well appearing patient in no apparent distress; mood and affect are within normal limits.  All skin waist up examined.  Objective  Left Abdomen (side) - Upper: Red papules and papulovesicles.    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 No cure, only control.   Chronic condition with duration or expected duration over one year. Condition is bothersome to patient. Currently flared.  Start TMC 0.1% cream twice a day as needed up to 2 weeks. Avoid applying to face, groin, and axilla. Use as directed. Risk of skin atrophy with long-term use reviewed.   Topical steroids (such as triamcinolone, fluocinolone, fluocinonide, mometasone, clobetasol, halobetasol, betamethasone, hydrocortisone) can cause thinning and lightening of the skin if they are used for too long in the same area. Your physician has selected the right strength medicine for your problem and area affected on the body. Please use your medication only as directed by your physician to prevent side effects.    Ordered Medications: triamcinolone (KENALOG) 0.1 %   Seborrheic Keratoses - Stuck-on, waxy, tan-brown papules and plaques  - Discussed benign etiology and prognosis. - Observe - Call for any changes  Actinic Damage - chronic, secondary to  cumulative UV radiation exposure/sun exposure over time - diffuse scaly erythematous macules with underlying dyspigmentation - Recommend daily broad spectrum sunscreen SPF 30+ to sun-exposed areas, reapply every 2 hours as needed.  - Recommend staying in the shade or wearing long sleeves, sun glasses (UVA+UVB protection) and wide brim hats (4-inch brim around the entire circumference of the hat). - Call for new or changing lesions.  Lentigines - Scattered tan macules - Due to sun exposure - Benign-appering, observe - Recommend daily broad spectrum sunscreen SPF 30+ to sun-exposed areas, reapply every 2 hours as needed. - Call for any changes  Melanocytic Nevi - Tan-brown and/or pink-flesh-colored symmetric macules and papules - Benign appearing on exam today - Observation - Call clinic for new or changing moles - Recommend daily use of broad spectrum spf 30+ sunscreen to sun-exposed areas.   Return for TBSE 2-3 months.  Graciella Belton, RMA, am acting as scribe for Forest Gleason, MD .  Documentation: I have reviewed the above documentation for accuracy and completeness, and I agree with the above.  Forest Gleason, MD

## 2021-03-15 NOTE — Patient Instructions (Addendum)
If you have any questions or concerns for your doctor, please call our main line at [phone] and press option 4 to reach your doctor's medical assistant. If no one answers, please leave a voicemail as directed and we will return your call as soon as possible. Messages left after 4 pm will be answered the following business day.   You may also send [place] a message via [place]. We typically respond to MyChart messages within 1-2 business days.  For prescription refills, please ask your pharmacy to contact our office. Our fax number is [phone].  If you have an urgent issue when the clinic is closed that cannot wait until the next business day, you can page your doctor at the number below.    Please note that while we do our best to be available for urgent issues outside of office hours, we are not available 24/7.   If you have an urgent issue and are unable to reach [place], you may choose to seek medical care at your doctor's office, retail clinic, urgent care center, or emergency room.  If you have a medical emergency, please immediately call 911 or go to the emergency department.  Pager Numbers  - Dr. Nehemiah Massed: [phone]  - Dr. Laurence Ferrari: [phone]  - Dr. Nicole Kindred: [phone]  In the event of inclement weather, please call our main line at [phone] for an update on the status of any delays or closures.  Dermatology Medication Tips: Please keep the boxes that topical medications come in in order to help keep track of the instructions about where and how to use these. Pharmacies typically print the medication instructions only on the boxes and not directly on the medication tubes.   If your medication is too expensive, please contact our office at [phone] option 4 or send [place] a message through [place].   We are unable to tell what your co-pay for medications will be in advance as this is different depending on your insurance coverage. However, we may be able to find a substitute  medication at lower cost or fill out paperwork to get insurance to cover a needed medication.   If a prior authorization is required to get your medication covered by your insurance company, please allow [place] 1-2 business days to complete this process.  Drug prices often vary depending on where the prescription is filled and some pharmacies may offer cheaper prices.  The website www.goodrx.com contains coupons for medications through different pharmacies. The prices here do not account for what the cost may be with help from insurance (it may be cheaper with your insurance), but the website can give you the price if you did not use any insurance.  - You can print the associated coupon and take it with your prescription to the pharmacy.  - You may also stop by our office during regular business hours and pick up a GoodRx coupon card.  - If you need your prescription sent electronically to a different pharmacy, notify our office through [facility] or by phone at [phone] option 4.   Recommend taking Heliocare sun protection supplement daily in sunny weather for additional sun protection. For maximum protection on the sunniest days, you can take up to 2 capsules of regular Heliocare OR take 1 capsule of Heliocare Ultra. For prolonged exposure (such as a full day in the sun), you can repeat your dose of the supplement 4 hours after your first dose. Heliocare can be purchased at [facility] or at [email or web address].  Topical steroids (such as triamcinolone, fluocinolone, fluocinonide, mometasone, clobetasol, halobetasol, betamethasone, hydrocortisone) can cause thinning and lightening of the skin if they are used for too long in the same area. Your physician has selected the right strength medicine for your problem and area affected on the body. Please use your medication only as directed by your physician to prevent side effects.    Recommend OTC [place] [employer] Rapid Relief Anti-Itch  cream (pramoxine + menthol) up to 3 times per day to areas that are itchy.

## 2021-03-26 ENCOUNTER — Encounter: Payer: Self-pay | Admitting: Dermatology

## 2021-05-23 ENCOUNTER — Ambulatory Visit: Payer: Self-pay | Admitting: Dermatology

## 2021-07-05 ENCOUNTER — Other Ambulatory Visit: Payer: Self-pay

## 2021-07-05 ENCOUNTER — Ambulatory Visit (INDEPENDENT_AMBULATORY_CARE_PROVIDER_SITE_OTHER): Payer: Self-pay | Admitting: Dermatology

## 2021-07-05 DIAGNOSIS — R21 Rash and other nonspecific skin eruption: Secondary | ICD-10-CM

## 2021-07-05 DIAGNOSIS — L821 Other seborrheic keratosis: Secondary | ICD-10-CM

## 2021-07-05 DIAGNOSIS — D492 Neoplasm of unspecified behavior of bone, soft tissue, and skin: Secondary | ICD-10-CM

## 2021-07-05 MED ORDER — TRIAMCINOLONE ACETONIDE 0.1 % EX CREA: 1.0000 "application " | TOPICAL_CREAM | Freq: Two times a day (BID) | CUTANEOUS | 1 refills | Status: DC

## 2021-07-05 NOTE — Patient Instructions (Addendum)
Recommend taking Heliocare sun protection supplement daily in sunny weather for additional sun protection. For maximum protection on the sunniest days, you can take up to 2 capsules of regular Heliocare OR take 1 capsule of Heliocare Ultra. For prolonged exposure (such as a full day in the sun), you can repeat your dose of the supplement 4 hours after your first dose. Heliocare can be purchased at [facility] or at [email or web address].    Recommend daily broad spectrum sunscreen SPF 30+ to sun-exposed areas, reapply every 2 hours as needed. Call for new or changing lesions.  Staying in the shade or wearing long sleeves, sun glasses (UVA+UVB protection) and wide brim hats (4-inch brim around the entire circumference of the hat) are also recommended for sun protection.    Topical steroids (such as triamcinolone, fluocinolone, fluocinonide, mometasone, clobetasol, halobetasol, betamethasone, hydrocortisone) can cause thinning and lightening of the skin if they are used for too long in the same area. Your physician has selected the right strength medicine for your problem and area affected on the body. Please use your medication only as directed by your physician to prevent side effects.    If you have any questions or concerns for your doctor, please call our main line at [phone] and press option 4 to reach your doctor's medical assistant. If no one answers, please leave a voicemail as directed and we will return your call as soon as possible. Messages left after 4 pm will be answered the following business day.   You may also send [place] a message via [place]. We typically respond to MyChart messages within 1-2 business days.  For prescription refills, please ask your pharmacy to contact our office. Our fax number is [phone].  If you have an urgent issue when the clinic is closed that cannot wait until the next business day, you can page your doctor at the number below.    Please note that  while we do our best to be available for urgent issues outside of office hours, we are not available 24/7.   If you have an urgent issue and are unable to reach [place], you may choose to seek medical care at your doctor's office, retail clinic, urgent care center, or emergency room.  If you have a medical emergency, please immediately call 911 or go to the emergency department.  Pager Numbers  - Dr. Nehemiah Massed: [phone]  - Dr. Laurence Ferrari: [phone]  - Dr. Nicole Kindred: [phone]  In the event of inclement weather, please call our main line at [phone] for an update on the status of any delays or closures.  Dermatology Medication Tips: Please keep the boxes that topical medications come in in order to help keep track of the instructions about where and how to use these. Pharmacies typically print the medication instructions only on the boxes and not directly on the medication tubes.  Seborrheic Keratosis  What causes seborrheic keratoses? Seborrheic keratoses are harmless, common skin growths that first appear during adult life.  As time goes by, more growths appear.  Some people may develop a large number of them.  Seborrheic keratoses appear on both covered and uncovered body parts.  They are not caused by sunlight.  The tendency to develop seborrheic keratoses can be inherited.  They vary in color from skin-colored to gray, brown, or even black.  They can be either smooth or have a rough, warty surface.   Seborrheic keratoses are superficial and look as if they were stuck on the skin.  Under the microscope this type of keratosis looks like layers upon layers of skin.  That is why at times the top layer may seem to fall off, but the rest of the growth remains and re-grows.    Treatment Seborrheic keratoses do not need to be treated, but can easily be removed in the office.  Seborrheic keratoses often cause symptoms when they rub on clothing or jewelry.  Lesions can be in the way of shaving.   If they become inflamed, they can cause itching, soreness, or burning.  Removal of a seborrheic keratosis can be accomplished by freezing, burning, or surgery. If any spot bleeds, scabs, or grows rapidly, please return to have it checked, as these can be an indication of a skin cancer.    If your medication is too expensive, please contact our office at [phone] option 4 or send [place] a message through [place].   We are unable to tell what your co-pay for medications will be in advance as this is different depending on your insurance coverage. However, we may be able to find a substitute medication at lower cost or fill out paperwork to get insurance to cover a needed medication.   If a prior authorization is required to get your medication covered by your insurance company, please allow [place] 1-2 business days to complete this process.  Drug prices often vary depending on where the prescription is filled and some pharmacies may offer cheaper prices.  The website www.goodrx.com contains coupons for medications through different pharmacies. The prices here do not account for what the cost may be with help from insurance (it may be cheaper with your insurance), but the website can give you the price if you did not use any insurance.  - You can print the associated coupon and take it with your prescription to the pharmacy.  - You may also stop by our office during regular business hours and pick up a GoodRx coupon card.  - If you need your prescription sent electronically to a different pharmacy, notify our office through [facility] or by phone at [phone] option 4.

## 2021-07-05 NOTE — Progress Notes (Signed)
   Follow-Up Visit   Subjective  Carol Harvey is a 60 y.o. female who presents for the following: Follow-up (Patient here today with concerns about a spot on right arm she noticed about 3 weeks ago. She states area is sometimes painful. She also reports this past Tuesday, she may have gotten bitten on right breast and is experiencing some pain. She also reports a mole on right side near axilla she noticed and would like checked. ).  The following portions of the chart were reviewed this encounter and updated as appropriate:  Tobacco  Allergies  Meds  Problems  Med Hx  Surg Hx  Fam Hx       Objective  Well appearing patient in no apparent distress; mood and affect are within normal limits.  A focused examination was performed including right forearm, right breast, and right side near axilla. Relevant physical exam findings are noted in the Assessment and Plan.  right areola Right areola with mild erythema  patient provided pictures showing broader area of ill-defined erythema  right ventral forearm  0.2 cm scaly pink papule   Assessment & Plan  Rash and other nonspecific skin eruption right areola  Favor irritant dermatitis/nipple dermatitis  Start triamcinolone 0.1% cream apply to affected areas of right breast twice daily for up to 2 weeks. Avoid applying to face, groin, and axilla. Use as directed. Risk of skin atrophy with long-term use reviewed.   Topical steroids (such as triamcinolone, fluocinolone, fluocinonide, mometasone, clobetasol, halobetasol, betamethasone, hydrocortisone) can cause thinning and lightening of the skin if they are used for too long in the same area. Your physician has selected the right strength medicine for your problem and area affected on the body. Please use your medication only as directed by your physician to prevent side effects.   Call if recurs or worsens.  triamcinolone cream (KENALOG) 0.1 % - right areola Apply 1 application  topically 2 (two) times daily. Apply to affected area of right breast. For 2 weeks. Avoid applying to face, groin, and axilla. Use as directed.  Neoplasm of skin right ventral forearm  Favor isk vs verruca > ak or other   Improving by history, discussed cryotherapy, patient deferred today   Patient will monitor and call if this gets larger or worsens  Seborrheic Keratoses - Stuck-on, waxy, tan-brown papules and/or plaques right axilla  - Benign-appearing - Discussed benign etiology and prognosis. - Observe - Call for any changes   Return if symptoms worsen or fail to improve. I, Ruthell Rummage, CMA, am acting as scribe for Forest Gleason, MD.  Documentation: I have reviewed the above documentation for accuracy and completeness, and I agree with the above.  Forest Gleason, MD

## 2021-07-11 ENCOUNTER — Encounter: Payer: Self-pay | Admitting: Dermatology

## 2021-07-25 ENCOUNTER — Ambulatory Visit: Payer: Self-pay | Admitting: Dermatology

## 2021-08-06 ENCOUNTER — Ambulatory Visit: Payer: Self-pay | Admitting: Dermatology

## 2021-11-12 ENCOUNTER — Other Ambulatory Visit: Payer: Self-pay

## 2021-11-12 ENCOUNTER — Ambulatory Visit: Payer: Self-pay | Admitting: Dermatology

## 2022-01-29 ENCOUNTER — Other Ambulatory Visit: Payer: Self-pay | Admitting: Internal Medicine

## 2022-01-29 DIAGNOSIS — Z1231 Encounter for screening mammogram for malignant neoplasm of breast: Secondary | ICD-10-CM

## 2022-03-04 ENCOUNTER — Ambulatory Visit
Admission: RE | Admit: 2022-03-04 | Discharge: 2022-03-04 | Disposition: A | Discharge status: Home / Self Care | Payer: Self-pay | Source: Ambulatory Visit | Attending: Internal Medicine | Admitting: Internal Medicine

## 2022-03-04 ENCOUNTER — Other Ambulatory Visit: Payer: Self-pay

## 2022-03-04 DIAGNOSIS — Z1231 Encounter for screening mammogram for malignant neoplasm of breast: Secondary | ICD-10-CM | POA: Insufficient documentation

## 2022-03-19 ENCOUNTER — Ambulatory Visit: Payer: Self-pay | Admitting: Dermatology

## 2022-05-15 ENCOUNTER — Ambulatory Visit (INDEPENDENT_AMBULATORY_CARE_PROVIDER_SITE_OTHER): Payer: Self-pay | Admitting: Dermatology

## 2022-05-15 DIAGNOSIS — L820 Inflamed seborrheic keratosis: Secondary | ICD-10-CM

## 2022-05-15 DIAGNOSIS — Z1283 Encounter for screening for malignant neoplasm of skin: Secondary | ICD-10-CM

## 2022-05-15 DIAGNOSIS — L821 Other seborrheic keratosis: Secondary | ICD-10-CM

## 2022-05-15 DIAGNOSIS — D1800 Hemangioma unspecified site: Secondary | ICD-10-CM

## 2022-05-15 DIAGNOSIS — L649 Androgenic alopecia, unspecified: Secondary | ICD-10-CM

## 2022-05-15 DIAGNOSIS — L578 Other skin changes due to chronic exposure to nonionizing radiation: Secondary | ICD-10-CM

## 2022-05-15 DIAGNOSIS — Z86018 Personal history of other benign neoplasm: Secondary | ICD-10-CM

## 2022-05-15 DIAGNOSIS — D229 Melanocytic nevi, unspecified: Secondary | ICD-10-CM

## 2022-05-15 DIAGNOSIS — L814 Other melanin hyperpigmentation: Secondary | ICD-10-CM

## 2022-05-15 NOTE — Progress Notes (Signed)
Follow-Up Visit   Subjective  Carol Harvey is a 61 y.o. female who presents for the following: Annual Exam (The patient presents for Total-Body Skin Exam (TBSE) for skin cancer screening and mole check.  The patient has spots, moles and lesions to be evaluated, some may be new or changing and the patient has concerns that these could be cancer. ). Patient c/o growths on the right temple and abdomen area growing and changing.   The following portions of the chart were reviewed this encounter and updated as appropriate:   Tobacco  Allergies  Meds  Problems  Med Hx  Surg Hx  Fam Hx     Review of Systems:  No other skin or systemic complaints except as noted in HPI or Assessment and Plan.  Objective  Well appearing patient in no apparent distress; mood and affect are within normal limits.  A full examination was performed including scalp, head, eyes, ears, nose, lips, neck, chest, axillae, abdomen, back, buttocks, bilateral upper extremities, bilateral lower extremities, hands, feet, fingers, toes, fingernails, and toenails. All findings within normal limits unless otherwise noted below.  right temple x 5,  right dorsum hand x 1, right flank x 1, right groin x 5, right medial thigh x 1. right lateral thigh x 1 (14) (14) Stuck-on, waxy, tan-brown papule or plaque --Discussed benign etiology and prognosis.   Scalp Round, patchy areas of nonscarring hair loss.    Assessment & Plan  Inflamed seborrheic keratosis (14) right temple x 5,  right dorsum hand x 1, right flank x 1, right groin x 5, right medial thigh x 1. right lateral thigh x 1 (14) Reassured benign age-related growth.  Recommend observation.  Discussed cryotherapy if spot(s) become irritated or inflamed.   Symptomatic, irritating, patient would like treated.  Destruction of lesion - right temple x 5,  right dorsum hand x 1, right flank x 1, right groin x 5, right medial thigh x 1. right lateral thigh x 1  (14) Complexity: simple   Destruction method: cryotherapy   Informed consent: discussed and consent obtained   Timeout:  patient name, date of birth, surgical site, and procedure verified Lesion destroyed using liquid nitrogen: Yes   Region frozen until ice ball extended beyond lesion: Yes   Outcome: patient tolerated procedure well with no complications   Post-procedure details: wound care instructions given    Androgenic alopecia Scalp   Androgenetic Alopecia (or Female pattern hair loss) refers to the common patterned hair loss affecting many men.  Female pattern alopecia is mediated by dihydrotestosterone which induces miniaturization of androgen-sensitive hair follicles.  It is chronic and persistent, but treatable; not curable. Topical treatment includes: - 5% topical Minoxidil Oral treatment includes: - Finasteride 1 mg qd - Minoxidil 1.25 - 5 mg qd - Dutasteride 0.5 mg qd Adjunct therapy includes: - Low Level Laser Light Therapy (LLLT) - Platelet-rich Plasma injections (PRP) - Hair Transplantation or scalp reduction   Patient being treated in [place] with Dr Irish Elders, continue treatment directed by Dr Irish Elders   Skin cancer screening  Lentigines - Scattered tan macules - Due to sun exposure - Benign-appearing, observe - Recommend daily broad spectrum sunscreen SPF 30+ to sun-exposed areas, reapply every 2 hours as needed. - Call for any changes  Seborrheic Keratoses - Stuck-on, waxy, tan-brown papules and/or plaques  - Benign-appearing - Discussed benign etiology and prognosis. - Observe - Call for any changes  Melanocytic Nevi - Tan-brown and/or pink-flesh-colored symmetric macules and papules -  Benign appearing on exam today - Observation - Call clinic for new or changing moles - Recommend daily use of broad spectrum spf 30+ sunscreen to sun-exposed areas.   Hemangiomas - Red papules - Discussed benign nature - Observe - Call for any changes  Actinic Damage -  Chronic condition, secondary to cumulative UV/sun exposure - diffuse scaly erythematous macules with underlying dyspigmentation - Recommend daily broad spectrum sunscreen SPF 30+ to sun-exposed areas, reapply every 2 hours as needed.  - Staying in the shade or wearing long sleeves, sun glasses (UVA+UVB protection) and wide brim hats (4-inch brim around the entire circumference of the hat) are also recommended for sun protection.  - Call for new or changing lesions.  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  - Call if any new or changing lesions are noted between office visits  Skin cancer screening performed today.   Return in about 1 year (around 05/16/2023), or TBSE,.  I, Marye Round, CMA, am acting as scribe for Sarina Ser, MD .  Documentation: I have reviewed the above documentation for accuracy and completeness, and I agree with the above.  Sarina Ser, MD

## 2022-05-15 NOTE — Patient Instructions (Addendum)

## 2022-05-25 ENCOUNTER — Encounter: Payer: Self-pay | Admitting: Dermatology

## 2022-08-19 ENCOUNTER — Ambulatory Visit (INDEPENDENT_AMBULATORY_CARE_PROVIDER_SITE_OTHER): Payer: Self-pay | Admitting: Dermatology

## 2022-08-19 DIAGNOSIS — L820 Inflamed seborrheic keratosis: Secondary | ICD-10-CM

## 2022-08-19 NOTE — Progress Notes (Unsigned)
   Follow-Up Visit   Subjective  Carol Harvey is a 61 y.o. female who presents for the following: Skin Problem (Patient reports spots trreated at face with ln2 and reports 1 spot at right forehead that did not go away and would like treated. ). The patient has spots, moles and lesions to be evaluated, some may be new or changing and the patient has concerns that these could be cancer.  The following portions of the chart were reviewed this encounter and updated as appropriate:  Tobacco  Allergies  Meds  Problems  Med Hx  Surg Hx  Fam Hx     Review of Systems: No other skin or systemic complaints except as noted in HPI or Assessment and Plan.  Objective  Well appearing patient in no apparent distress; mood and affect are within normal limits.  A focused examination was performed including head, including the scalp, face, neck, nose, ears, eyelids, and lips. Relevant physical exam findings are noted in the Assessment and Plan.  right forehead x 5 (5) Erythematous stuck-on, waxy papule or plaque   Assessment & Plan  Inflamed seborrheic keratosis (5) - persistent (touch up - no charge today) right forehead x 5 Symptomatic, irritating, patient would like treated.  Destruction of lesion - right forehead x 5 Complexity: simple   Destruction method: cryotherapy   Informed consent: discussed and consent obtained   Timeout:  patient name, date of birth, surgical site, and procedure verified Lesion destroyed using liquid nitrogen: Yes   Region frozen until ice ball extended beyond lesion: Yes   Outcome: patient tolerated procedure well with no complications   Post-procedure details: wound care instructions given   Additional details:  Prior to procedure, discussed risks of blister formation, small wound, skin dyspigmentation, or rare scar following cryotherapy. Recommend Vaseline ointment to treated areas while healing.  Return for keep follow up as scheduled 05/22/23. IRuthell Rummage, CMA, am acting as scribe for Sarina Ser, MD. Documentation: I have reviewed the above documentation for accuracy and completeness, and I agree with the above.  Sarina Ser, MD

## 2022-08-19 NOTE — Patient Instructions (Addendum)
Seborrheic Keratosis  What causes seborrheic keratoses? Seborrheic keratoses are harmless, common skin growths that first appear during adult life.  As time goes by, more growths appear.  Some people may develop a large number of them.  Seborrheic keratoses appear on both covered and uncovered body parts.  They are not caused by sunlight.  The tendency to develop seborrheic keratoses can be inherited.  They vary in color from skin-colored to gray, brown, or even black.  They can be either smooth or have a rough, warty surface.   Seborrheic keratoses are superficial and look as if they were stuck on the skin.  Under the microscope this type of keratosis looks like layers upon layers of skin.  That is why at times the top layer may seem to fall off, but the rest of the growth remains and re-grows.    Treatment Seborrheic keratoses do not need to be treated, but can easily be removed in the office.  Seborrheic keratoses often cause symptoms when they rub on clothing or jewelry.  Lesions can be in the way of shaving.  If they become inflamed, they can cause itching, soreness, or burning.  Removal of a seborrheic keratosis can be accomplished by freezing, burning, or surgery. If any spot bleeds, scabs, or grows rapidly, please return to have it checked, as these can be an indication of a skin cancer.  Due to recent changes in healthcare laws, you may see results of your pathology and/or laboratory studies on MyChart before the doctors have had a chance to review them. We understand that in some cases there may be results that are confusing or concerning to you. Please understand that not all results are received at the same time and often the doctors may need to interpret multiple results in order to provide you with the best plan of care or course of treatment. Therefore, we ask that you please give us 2 business days to thoroughly review all your results before contacting the office for clarification. Should  we see a critical lab result, you will be contacted sooner.    Cryotherapy Aftercare  Wash gently with soap and water everyday.   Apply Vaseline and Band-Aid daily until healed.      If You Need Anything After Your Visit  If you have any questions or concerns for your doctor, please call our main line at [phone] and press option 4 to reach your doctor's medical assistant. If no one answers, please leave a voicemail as directed and we will return your call as soon as possible. Messages left after 4 pm will be answered the following business day.   You may also send us a message via MyChart. We typically respond to MyChart messages within 1-2 business days.  For prescription refills, please ask your pharmacy to contact our office. Our fax number is [phone].  If you have an urgent issue when the clinic is closed that cannot wait until the next business day, you can page your doctor at the number below.    Please note that while we do our best to be available for urgent issues outside of office hours, we are not available 24/7.   If you have an urgent issue and are unable to reach us, you may choose to seek medical care at your doctor's office, retail clinic, urgent care center, or emergency room.  If you have a medical emergency, please immediately call 911 or go to the emergency department.  Pager Numbers  - Dr.   [phone]  - Dr. Laurence Ferrari: [phone]  - Dr. Nicole Kindred: [phone]  In the event of inclement weather, please call our main line at [phone] for an update on the status of any delays or closures.  Dermatology Medication Tips: Please keep the boxes that topical medications come in in order to help keep track of the instructions about where and how to use these. Pharmacies typically print the medication instructions only on the boxes and not directly on the medication tubes.   If your medication is too expensive, please contact our office at  [phone] option 4 or send [place] a message through [place].   We are unable to tell what your co-pay for medications will be in advance as this is different depending on your insurance coverage. However, we may be able to find a substitute medication at lower cost or fill out paperwork to get insurance to cover a needed medication.   If a prior authorization is required to get your medication covered by your insurance company, please allow [place] 1-2 business days to complete this process.  Drug prices often vary depending on where the prescription is filled and some pharmacies may offer cheaper prices.  The website www.goodrx.com contains coupons for medications through different pharmacies. The prices here do not account for what the cost may be with help from insurance (it may be cheaper with your insurance), but the website can give you the price if you did not use any insurance.  - You can print the associated coupon and take it with your prescription to the pharmacy.  - You may also stop by our office during regular business hours and pick up a GoodRx coupon card.  - If you need your prescription sent electronically to a different pharmacy, notify our office through [facility] or by phone at [phone] option 4.     Si Usted Necesita Algo Despus de Su Visita  Tambin puede enviarnos un mensaje a travs de Pharmacist, community. Por lo general respondemos a los mensajes de MyChart en el transcurso de 1 a 2 das hbiles.  Para renovar recetas, por favor pida a su farmacia que se ponga en contacto con nuestra oficina. Harland Dingwall de fax es [place] [phone].  Si tiene un asunto urgente cuando la clnica est cerrada y que no puede esperar hasta el siguiente da hbil, puede llamar/localizar a su doctor(a) al nmero que aparece a continuacin.   Por favor, tenga en cuenta que aunque hacemos todo lo posible para estar disponibles para asuntos urgentes fuera del horario de [place], no estamos  disponibles las 24 horas del da, los 7 das de la [place].   Si tiene un problema urgente y no puede comunicarse con nosotros, puede optar por buscar atencin mdica  en el consultorio de su doctor(a), en una clnica privada, en un centro de atencin urgente o en una sala de emergencias.  Si tiene Engineering geologist, por favor llame inmediatamente al 911 o vaya a la sala de emergencias.  Nmeros de bper  - Dr. Nehemiah Massed: [phone]  - Dra. Moye: [phone]  - Dra. Nicole Kindred: [phone]  En caso de inclemencias del [place], por favor llame a Johnsie Kindred principal al [phone] para una actualizacin sobre el [place] de cualquier retraso o cierre.  Consejos para la medicacin en dermatologa: Por favor, guarde las cajas en las que vienen los medicamentos de uso tpico para ayudarle a seguir las instrucciones sobre dnde y cmo usarlos. Las farmacias generalmente imprimen las instrucciones del medicamento slo en  no directamente en los tubos del medicamento.   Si su medicamento es muy caro, por favor, pngase en contacto con nuestra oficina llamando al [phone] y presione la opcin 4 o envenos un mensaje a travs de MyChart.   No podemos decirle cul ser su copago por los medicamentos por adelantado ya que esto es diferente dependiendo de la cobertura de su seguro. Sin embargo, es posible que podamos encontrar un medicamento sustituto a menor costo o llenar un formulario para que el seguro cubra el medicamento que se considera necesario.   Si se requiere una autorizacin previa para que su compaa de seguros cubra su medicamento, por favor permtanos de 1 a 2 das hbiles para completar este proceso.  Los precios de los medicamentos varan con frecuencia dependiendo del lugar de dnde se surte la receta y alguna farmacias pueden ofrecer precios ms baratos.  El sitio web www.goodrx.com tiene cupones para medicamentos de diferentes farmacias. Los precios aqu no  tienen en cuenta lo que podra costar con la ayuda del seguro (puede ser ms barato con su seguro), pero el sitio web puede darle el precio si no utiliz ningn seguro.  - Puede imprimir el cupn correspondiente y llevarlo con su receta a la farmacia.  - Tambin puede pasar por nuestra oficina durante el horario de atencin regular y recoger una tarjeta de cupones de GoodRx.  - Si necesita que su receta se enve electrnicamente a una farmacia diferente, informe a nuestra oficina a travs de MyChart de  o por telfono llamando al [phone] y presione la opcin 4.  

## 2022-08-21 ENCOUNTER — Encounter: Payer: Self-pay | Admitting: Dermatology

## 2022-10-07 ENCOUNTER — Ambulatory Visit (INDEPENDENT_AMBULATORY_CARE_PROVIDER_SITE_OTHER): Payer: Self-pay | Admitting: Dermatology

## 2022-10-07 DIAGNOSIS — L309 Dermatitis, unspecified: Secondary | ICD-10-CM

## 2022-10-07 DIAGNOSIS — D692 Other nonthrombocytopenic purpura: Secondary | ICD-10-CM

## 2022-10-07 NOTE — Progress Notes (Signed)
   Follow-Up Visit   Subjective  Carol Harvey is a 61 y.o. female who presents for the following: check spot (R forehead, ~1wk, itchy prn) and check toe (L 5th toe, pt hit it today).   The following portions of the chart were reviewed this encounter and updated as appropriate:       Review of Systems:  No other skin or systemic complaints except as noted in HPI or Assessment and Plan.  Objective  Well appearing patient in no apparent distress; mood and affect are within normal limits.  A focused examination was performed including face. Relevant physical exam findings are noted in the Assessment and Plan.  L 5th toe Edema with purpura L 5th toe  R forehead Small pink scaly patch    Assessment & Plan  Purpura (HCC) L 5th toe  H/o recent trauma  Discussed can't tell if toe is broken without an x-ray Discussed can wrap toe to adjacent toe with athletic tape to stabilize  Can ice toe and take Ibuprofen to decrease swelling  Eczema, unspecified type R forehead  Vrs ISK  Start Eucrisa oint bid to aa forehead until resolved, samples x 2 Lot TNDA, exp 12/2023 RTC if doesn't improved   Return for as scheduled.  I, Othelia Pulling, RMA, am acting as scribe for Brendolyn Patty, MD .  Documentation: I have reviewed the above documentation for accuracy and completeness, and I agree with the above.  Brendolyn Patty MD

## 2022-10-07 NOTE — Patient Instructions (Addendum)
Eucrisa ointment Apply to pink patch on right forehead 2 times a day for at least 2 weeks    Due to recent changes in healthcare laws, you may see results of your pathology and/or laboratory studies on MyChart before the doctors have had a chance to review them. We understand that in some cases there may be results that are confusing or concerning to you. Please understand that not all results are received at the same time and often the doctors may need to interpret multiple results in order to provide you with the best plan of care or course of treatment. Therefore, we ask that you please give [place] 2 business days to thoroughly review all your results before contacting the office for clarification. Should we see a critical lab result, you will be contacted sooner.   If You Need Anything After Your Visit  If you have any questions or concerns for your doctor, please call our main line at [phone] and press option 4 to reach your doctor's medical assistant. If no one answers, please leave a voicemail as directed and we will return your call as soon as possible. Messages left after 4 pm will be answered the following business day.   You may also send [place] a message via [place]. We typically respond to MyChart messages within 1-2 business days.  For prescription refills, please ask your pharmacy to contact our office. Our fax number is [phone].  If you have an urgent issue when the clinic is closed that cannot wait until the next business day, you can page your doctor at the number below.    Please note that while we do our best to be available for urgent issues outside of office hours, we are not available 24/7.   If you have an urgent issue and are unable to reach [place], you may choose to seek medical care at your doctor's office, retail clinic, urgent care center, or emergency room.  If you have a medical emergency, please immediately call 911 or go to the emergency department.  Pager  Numbers  - Dr. Nehemiah Massed: [phone]  - Dr. Laurence Ferrari: [phone]  - Dr. Nicole Kindred: [phone]  In the event of inclement weather, please call our main line at [phone] for an update on the status of any delays or closures.  Dermatology Medication Tips: Please keep the boxes that topical medications come in in order to help keep track of the instructions about where and how to use these. Pharmacies typically print the medication instructions only on the boxes and not directly on the medication tubes.   If your medication is too expensive, please contact our office at [phone] option 4 or send [place] a message through [place].   We are unable to tell what your co-pay for medications will be in advance as this is different depending on your insurance coverage. However, we may be able to find a substitute medication at lower cost or fill out paperwork to get insurance to cover a needed medication.   If a prior authorization is required to get your medication covered by your insurance company, please allow [place] 1-2 business days to complete this process.  Drug prices often vary depending on where the prescription is filled and some pharmacies may offer cheaper prices.  The website www.goodrx.com contains coupons for medications through different pharmacies. The prices here do not account for what the cost may be with help from insurance (it may be cheaper with your insurance), but the website can give you  the price if you did not use any insurance.  - You can print the associated coupon and take it with your prescription to the pharmacy.  - You may also stop by our office during regular business hours and pick up a GoodRx coupon card.  - If you need your prescription sent electronically to a different pharmacy, notify our office through [facility] or by phone at [phone] option 4.     Si Usted Necesita Algo Despus de Su Visita  Tambin puede enviarnos un mensaje a travs de  Pharmacist, community. Por lo general respondemos a los mensajes de MyChart en el transcurso de 1 a 2 das hbiles.  Para renovar recetas, por favor pida a su farmacia que se ponga en contacto con nuestra oficina. Harland Dingwall de fax es [place] [phone].  Si tiene un asunto urgente cuando la clnica est cerrada y que no puede esperar hasta el siguiente da hbil, puede llamar/localizar a su doctor(a) al nmero que aparece a continuacin.   Por favor, tenga en cuenta que aunque hacemos todo lo posible para estar disponibles para asuntos urgentes fuera del horario de [place], no estamos disponibles las 24 horas del da, los 7 das de la [place].   Si tiene un problema urgente y no puede comunicarse con nosotros, puede optar por buscar atencin mdica  en el consultorio de su doctor(a), en una clnica privada, en un centro de atencin urgente o en una sala de emergencias.  Si tiene Engineering geologist, por favor llame inmediatamente al 911 o vaya a la sala de emergencias.  Nmeros de bper  - Dr. Nehemiah Massed: [phone]  - Dra. Moye: [phone]  - Dra. Nicole Kindred: [phone]  En caso de inclemencias del [place], por favor llame a Johnsie Kindred principal al [phone] para una actualizacin sobre el [place] de cualquier retraso o cierre.  Consejos para la medicacin en dermatologa: Por favor, guarde las cajas en las que vienen los medicamentos de uso tpico para ayudarle a seguir las instrucciones sobre dnde y cmo usarlos. Las farmacias generalmente imprimen las instrucciones del medicamento slo en las cajas y no directamente en los tubos del [place].   Si su medicamento es muy caro, por favor, pngase en contacto con Zigmund Daniel llamando al [phone] y presione la opcin 4 o envenos un mensaje a travs de Pharmacist, community.   No podemos decirle cul ser su copago por los medicamentos por adelantado ya que esto es diferente dependiendo de la cobertura de su seguro. Sin embargo, es posible que  podamos encontrar un medicamento sustituto a Electrical engineer un formulario para que el seguro cubra el medicamento que se considera necesario.   Si se requiere una autorizacin previa para que su compaa de seguros [place] su medicamento, por favor permtanos de 1 a 2 das hbiles para completar este proceso.  Los precios de los medicamentos varan con frecuencia dependiendo del Environmental consultant de dnde se surte la receta y alguna farmacias pueden ofrecer precios ms baratos.  El sitio web www.goodrx.com tiene cupones para medicamentos de Airline pilot. Los precios aqu no tienen en cuenta lo que podra costar con la ayuda del seguro (puede ser ms barato con su seguro), pero el sitio web puede darle el precio si no utiliz Research scientist (physical sciences).  - Puede imprimir el cupn correspondiente y llevarlo con su receta a la farmacia.  - Tambin puede pasar por nuestra oficina durante el horario de atencin regular y Charity fundraiser una tarjeta de cupones de GoodRx.  - Si necesita  Si necesita que su receta se enve electrnicamente a una farmacia diferente, informe a nuestra oficina a travs de MyChart de  o por telfono llamando al [phone] y presione la opcin 4.  

## 2022-10-08 ENCOUNTER — Ambulatory Visit (INDEPENDENT_AMBULATORY_CARE_PROVIDER_SITE_OTHER): Payer: Self-pay

## 2022-10-08 ENCOUNTER — Ambulatory Visit (INDEPENDENT_AMBULATORY_CARE_PROVIDER_SITE_OTHER): Payer: Self-pay | Admitting: Podiatry

## 2022-10-08 DIAGNOSIS — S92515A Nondisplaced fracture of proximal phalanx of left lesser toe(s), initial encounter for closed fracture: Secondary | ICD-10-CM

## 2022-10-08 NOTE — Progress Notes (Signed)
   No chief complaint on file.   HPI: 61 y.o. female presenting today for new complaint of pain and tenderness to the left fifth toe after hitting her foot on an ottoman yesterday evening.  Patient has noticed bruising with swelling and pain to the area.  She has not done anything for treatment.  Presenting for further treatment and evaluation  Past Medical History:  Diagnosis Date   Actinic keratosis    Allergic rhinitis    Asthma    Dysplastic nevus 06/12/2005   right inframammary - mild atypia   Dysplastic nevus 08/16/1999   right infra axillary- mild atypia   Dysplastic nevus 11/04/1995   right breast medial areola- moderate atypia   Tremor     Past Surgical History:  Procedure Laterality Date   ABDOMINAL HYSTERECTOMY  2007   for dysfunctional uterine bleeding    BILATERAL OOPHORECTOMY     OOPHORECTOMY      Allergies  Allergen Reactions   Codeine     hallucination   Minocycline Hives   Wellbutrin [Bupropion]    Pristiq [Desvenlafaxine] Rash     Physical Exam: General: The patient is alert and oriented x3 in no acute distress.  Dermatology: Skin is warm, dry and supple bilateral lower extremities. Negative for open lesions or macerations.  Vascular: Palpable pedal pulses bilaterally. Capillary refill within normal limits.  There are some moderate edema with ecchymosis noted around the fifth MTP joint of the left foot  Neurological: Light touch and protective threshold grossly intact  Musculoskeletal Exam: No pedal deformities noted  Radiographic Exam:  Normal osseous mineralization. Joint spaces preserved.  Slightly oblique transverse type acute fracture noted along the base of the proximal phalanx left fifth toe.  Nondisplaced.  Assessment: 1.  Fracture base of the proximal phalanx left fifth toe, extra-articular   Plan of Care:  1. Patient evaluated. X-Rays reviewed.  2.  Postsurgical shoe was offered and declined 3.  Recommend conservative treatment  including rest ice elevation is much as possible. 4.  Recommend ice especially in the evenings and OTC anti-inflammatories such as ibuprofen as needed 5.  Recommend good supportive shoes that are wide in the toebox area and do not irritate or constrict the toes 6.  Return to clinic as needed      Edrick Kins, DPM [facility]  Dr. Edrick Kins, DPM    [address], [place] [address]                Office [phone]  Fax [phone]

## 2023-02-13 ENCOUNTER — Other Ambulatory Visit: Payer: Self-pay | Admitting: Internal Medicine

## 2023-02-13 DIAGNOSIS — Z1231 Encounter for screening mammogram for malignant neoplasm of breast: Secondary | ICD-10-CM

## 2023-04-01 ENCOUNTER — Ambulatory Visit
Admission: RE | Admit: 2023-04-01 | Discharge: 2023-04-01 | Disposition: A | Discharge status: Home / Self Care | Payer: Self-pay | Source: Ambulatory Visit | Attending: Internal Medicine | Admitting: Internal Medicine

## 2023-04-01 DIAGNOSIS — Z1231 Encounter for screening mammogram for malignant neoplasm of breast: Secondary | ICD-10-CM | POA: Insufficient documentation

## 2023-05-22 ENCOUNTER — Ambulatory Visit: Payer: Self-pay | Admitting: Dermatology

## 2023-08-27 ENCOUNTER — Ambulatory Visit: Payer: Self-pay | Admitting: Dermatology

## 2024-01-07 ENCOUNTER — Ambulatory Visit (INDEPENDENT_AMBULATORY_CARE_PROVIDER_SITE_OTHER): Payer: Medicare Other | Admitting: Dermatology

## 2024-01-07 DIAGNOSIS — L820 Inflamed seborrheic keratosis: Secondary | ICD-10-CM | POA: Diagnosis not present

## 2024-01-07 DIAGNOSIS — L814 Other melanin hyperpigmentation: Secondary | ICD-10-CM

## 2024-01-07 DIAGNOSIS — L821 Other seborrheic keratosis: Secondary | ICD-10-CM

## 2024-01-07 DIAGNOSIS — D229 Melanocytic nevi, unspecified: Secondary | ICD-10-CM

## 2024-01-07 DIAGNOSIS — L578 Other skin changes due to chronic exposure to nonionizing radiation: Secondary | ICD-10-CM | POA: Diagnosis not present

## 2024-01-07 DIAGNOSIS — W908XXA Exposure to other nonionizing radiation, initial encounter: Secondary | ICD-10-CM | POA: Diagnosis not present

## 2024-01-07 DIAGNOSIS — Z1283 Encounter for screening for malignant neoplasm of skin: Secondary | ICD-10-CM

## 2024-01-07 DIAGNOSIS — Z872 Personal history of diseases of the skin and subcutaneous tissue: Secondary | ICD-10-CM

## 2024-01-07 DIAGNOSIS — Z86018 Personal history of other benign neoplasm: Secondary | ICD-10-CM

## 2024-01-07 DIAGNOSIS — D1801 Hemangioma of skin and subcutaneous tissue: Secondary | ICD-10-CM

## 2024-01-07 DIAGNOSIS — L304 Erythema intertrigo: Secondary | ICD-10-CM

## 2024-01-07 DIAGNOSIS — L988 Other specified disorders of the skin and subcutaneous tissue: Secondary | ICD-10-CM

## 2024-01-07 MED ORDER — KETOCONAZOLE 2 % EX CREA: TOPICAL_CREAM | CUTANEOUS | 2 refills | Status: AC

## 2024-01-07 MED ORDER — HYDROCORTISONE 2.5 % EX CREA: TOPICAL_CREAM | CUTANEOUS | 2 refills | Status: AC

## 2024-01-07 NOTE — Progress Notes (Signed)
 Follow-Up Visit   Subjective  Carol Harvey is a 63 y.o. female who presents for the following: Skin Cancer Screening and Full Body Skin Exam  The patient presents for Total-Body Skin Exam (TBSE) for skin cancer screening and mole check. The patient has spots, moles and lesions to be evaluated, some may be new or changing. The patient c/o a spot on her left leg she would like removed that is bothersome, and scaling on her forehead area. History of AKs. No history of skin cancer.     The following portions of the chart were reviewed this encounter and updated as appropriate: medications, allergies, medical history  Review of Systems:  No other skin or systemic complaints except as noted in HPI or Assessment and Plan.  Objective  Well appearing patient in no apparent distress; mood and affect are within normal limits.  A full examination was performed including scalp, head, eyes, ears, nose, lips, neck, chest, axillae, abdomen, back, buttocks, bilateral upper extremities, bilateral lower extremities, hands, feet, fingers, toes, fingernails, and toenails. All findings within normal limits unless otherwise noted below.   Relevant physical exam findings are noted in the Assessment and Plan.  L lat lower thigh x 1 Erythematous stuck-on, waxy papule R glabella x 1 Pink scaly thin papule, slightly waxy  Assessment & Plan   SKIN CANCER SCREENING PERFORMED TODAY.  ACTINIC DAMAGE - Chronic condition, secondary to cumulative UV/sun exposure - diffuse scaly erythematous macules with underlying dyspigmentation - Recommend daily broad spectrum sunscreen SPF 30+ to sun-exposed areas, reapply every 2 hours as needed.  - Staying in the shade or wearing long sleeves, sun glasses (UVA+UVB protection) and wide brim hats (4-inch brim around the entire circumference of the hat) are also recommended for sun protection.  - Call for new or changing lesions.  LENTIGINES, SEBORRHEIC KERATOSES,  HEMANGIOMAS - Benign normal skin lesions - Benign-appearing - Call for any changes  MELANOCYTIC NEVI - Tan-brown and/or pink-flesh-colored symmetric macules and papules - Benign appearing on exam today - Observation - Call clinic for new or changing moles - Recommend daily use of broad spectrum spf 30+ sunscreen to sun-exposed areas.   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 Condition is symptomatic/ bothersome to patient. Not currently at goal.   Intertrigo is a chronic recurrent rash that occurs in skin fold areas that may be associated with friction; heat; moisture; yeast; fungus; and bacteria.  It is exacerbated by increased movement / activity; sweating; and higher atmospheric temperature.  Use of an absorbant powder such as Zeasorb AF powder or other OTC antifungal powder to the area daily can prevent rash recurrence. Other options to help keep the area dry include blow drying the area after bathing or using antiperspirant products such as Duradry sweat minimizing gel.  Treatment Plan: Start ketoconazole  2% cream once or twice a day as needed for rash.  Start hydrocortisone  2.5% cream once or twice a day for up to 2 weeks as needed for rash.   Mix equal amounts of hydrocortisone  2.5% cream with ketaconazole 2% cream and apply to affected areas twice a  day.  If improved, decrease to hydrocortisone  and ketaconazole mixed once a day. If still clear, decrease to ketaconazole cream only, once daily to help prevent flares.  Do not use the hydrocortisone  cream for maintenance, since  long term use of a topical steroid can thin the skin.  May repeat regimen as needed for flares.   Topical steroids (such as triamcinolone , fluocinolone, fluocinonide, mometasone, clobetasol, halobetasol, betamethasone, hydrocortisone ) can cause thinning and lightening of the skin if they are used for too long in the same area. Your physician has selected the right strength medicine for your problem and area affected on the body. Please use your medication only as directed by your physician to prevent side effects.   To prevent recurrence, recommend OTC Zeasorb AF powder to body folds daily after shower.  It is often found in the athlete's foot section in the pharmacy.  Avoid using powders that contain cornstarch.    FACIAL ELASTOSIS Exam: Rhytides and volume loss.  Treatment Plan: Discussed Alastin products, retinols to face.  AmLactin samples given to patient to apply to arms.   Recommend daily broad spectrum sunscreen SPF 30+ to sun-exposed areas, reapply every 2 hours as needed. Call for new or changing lesions.  Staying in the shade or wearing long sleeves, sun glasses (UVA+UVB protection) and wide brim hats (4-inch brim around the entire circumference of the hat) are also recommended for sun protection.  Basic OTC daily skin care regimen to prevent photoaging:   Recommend facial moisturizer with sunscreen SPF 30 every morning (OTC brands include CeraVe AM, Neutrogena, Eucerin, Cetaphil, Aveeno, La Roche Posay).  Can also apply a topical Vit C serum which is an antioxidant (OTC brands include CeraVe, La Roche Posay, and The Ordinary) underneath sunscreen in morning. If you are outside during the day in the summer for extended periods, especially swimming and/or sweating, make sure you apply a water resistant facial sunscreen lotion spf 30 or higher.   At night recommend a cream with retinol (a vitamin A derivative which stimulates collagen production) like CeraVe skin renewing retinol serum or  ROC retinol correxion cream or Neutrogena rapid wrinkle repair cream. Retinol may cause skin irritation in people with sensitive skin.  Can use it every other day and/or apply on top of a hyaluronic acid (HA) moisturizer/serum (Neutrogena Hydroboost water cream) if better tolerated that way.  Retinol may also help with lightening brown spots.   Our office sells high quality, medically tested skin care lines such as Elta MD sunscreens (with Zinc), and Alastin skin care products, which are very effective in treating photoaging. The Alastin line includes cosmeceutical grade Vit.C serum, HA serum, Elastin stimulating moisturizers/serums, lightening serum, and sunscreens.  If you want prescription treatment, then you would need an appointment (Rx tretinoin and fade creams, Botox, filler injections, laser treatments, etc.) These prescriptions and procedures are not covered by insurance but work very well.   INFLAMED SEBORRHEIC KERATOSIS L lat lower thigh x 1 Symptomatic, irritating, patient would like treated. Destruction of lesion - L lat lower thigh x 1  Destruction method: cryotherapy   Informed consent: discussed and consent obtained   Lesion destroyed using liquid nitrogen: Yes   Region frozen until ice ball extended beyond lesion: Yes   Outcome: patient tolerated procedure well with no complications   Post-procedure details: wound care instructions given   Additional details:  Prior to procedure, discussed risks of blister formation, small wound, skin dyspigmentation, or rare scar following cryotherapy. Recommend Vaseline ointment to treated  areas while healing.  SEBORRHEIC KERATOSIS, INFLAMED R glabella x 1 vs AK.   Patient defers treatment today due to upcoming trip.  Return 6-8 weeks, for tx AK R glabella and recheck ISK L thigh.  IAndrea Kerns, CMA, am acting as scribe for Rexene Rattler, MD .   Documentation: I have reviewed the above documentation for accuracy and completeness,  and I agree with the above.  Rexene Rattler, MD

## 2024-01-07 NOTE — Patient Instructions (Addendum)
 Cryotherapy Aftercare  Wash gently with soap and water everyday.   Apply Vaseline and Band-Aid daily until healed.    Intertrigo is a chronic recurrent rash that occurs in skin fold areas that may be associated with friction; heat; moisture; yeast; fungus; and bacteria.  It is exacerbated by increased movement / activity; sweating; and higher atmospheric temperature.  Use of an absorbant powder such as Zeasorb AF powder or other OTC antifungal powder to the area daily can prevent rash recurrence. Other options to help keep the area dry include blow drying the area after bathing or using antiperspirant products such as Duradry sweat minimizing gel.  Start ketoconazole  2% cream once or twice a day as needed for rash.  Start hydrocortisone  2.5% cream once or twice a day for up to 2 weeks as needed for rash.   Mix equal amounts of hydrocortisone  2.5% cream with ketaconazole 2% cream and apply to affected areas twice a day.  If improved, decrease to hydrocortisone  and ketaconazole mixed once a day. If still clear, decrease to ketaconazole cream only, once daily to help prevent flares.  Do not use the hydrocortisone  cream for maintenance, since long term use of a topical steroid can thin the skin.  May repeat regimen as needed for flares.   Topical steroids (such as triamcinolone , fluocinolone, fluocinonide, mometasone, clobetasol, halobetasol, betamethasone, hydrocortisone ) can cause thinning and lightening of the skin if they are used for too long in the same area. Your physician has selected the right strength medicine for your problem and area affected on the body. Please use your medication only as directed by your physician to prevent side effects.   To prevent recurrence, recommend OTC Zeasorb AF powder to body folds daily after shower.  It is often found in the athlete's foot section in the pharmacy.  Avoid using powders that contain cornstarch.    Basic OTC daily skin care regimen to prevent  photoaging:   Recommend facial moisturizer with sunscreen SPF 30 every morning (OTC brands include CeraVe AM, Neutrogena, Eucerin, Cetaphil, Aveeno, La Roche Posay).  Can also apply a topical Vit C serum which is an antioxidant (OTC brands include CeraVe, La Roche Posay, and The Ordinary) underneath sunscreen in morning. If you are outside during the day in the summer for extended periods, especially swimming and/or sweating, make sure you apply a water resistant facial sunscreen lotion spf 30 or higher.   At night recommend a cream with retinol (a vitamin A derivative which stimulates collagen production) like CeraVe skin renewing retinol serum or ROC retinol correxion cream or Neutrogena rapid wrinkle repair cream. Retinol may cause skin irritation in people with sensitive skin.  Can use it every other day and/or apply on top of a hyaluronic acid (HA) moisturizer/serum (Neutrogena Hydroboost water cream) if better tolerated that way.  Retinol may also help with lightening brown spots.   Our office sells high quality, medically tested skin care lines such as Elta MD sunscreens (with Zinc), and Alastin skin care products, which are very effective in treating photoaging. The Alastin line includes cosmeceutical grade Vit.C serum, HA serum, Elastin stimulating moisturizers/serums, lightening serum, and sunscreens.  If you want prescription treatment, then you would need an appointment (Rx tretinoin and fade creams, Botox, filler injections, laser treatments, etc.) These prescriptions and procedures are not covered by insurance but work very well.  Melanoma ABCDEs  Melanoma is the most dangerous type of skin cancer, and is the leading cause of death from skin disease.  You  are more likely to develop melanoma if you: Have light-colored skin, light-colored eyes, or red or blond hair Spend a lot of time in the sun Tan regularly, either outdoors or in a tanning bed Have had blistering sunburns, especially  during childhood Have a close family member who has had a melanoma Have atypical moles or large birthmarks  Early detection of melanoma is key since treatment is typically straightforward and cure rates are extremely high if we catch it early.   The first sign of melanoma is often a change in a mole or a new dark spot.  The ABCDE system is a way of remembering the signs of melanoma.  A for asymmetry:  The two halves do not match. B for border:  The edges of the growth are irregular. C for color:  A mixture of colors are present instead of an even brown color. D for diameter:  Melanomas are usually (but not always) greater than 6mm - the size of a pencil eraser. E for evolution:  The spot keeps changing in size, shape, and color.  Please check your skin once per month between visits. You can use a small mirror in front and a large mirror behind you to keep an eye on the back side or your body.   If you see any new or changing lesions before your next follow-up, please call to schedule a visit.  Please continue daily skin protection including broad spectrum sunscreen SPF 30+ to sun-exposed areas, reapplying every 2 hours as needed when you're outdoors.   Staying in the shade or wearing long sleeves, sun glasses (UVA+UVB protection) and wide brim hats (4-inch brim around the entire circumference of the hat) are also recommended for sun protection.   Due to recent changes in healthcare laws, you may see results of your pathology and/or laboratory studies on MyChart before the doctors have had a chance to review them. We understand that in some cases there may be results that are confusing or concerning to you. Please understand that not all results are received at the same time and often the doctors may need to interpret multiple results in order to provide you with the best plan of care or course of treatment. Therefore, we ask that you please give us  2 business days to thoroughly review all your  results before contacting the office for clarification. Should we see a critical lab result, you will be contacted sooner.   If You Need Anything After Your Visit  If you have any questions or concerns for your doctor, please call our main line at [phone] and press option 4 to reach your doctor's medical assistant. If no one answers, please leave a voicemail as directed and we will return your call as soon as possible. Messages left after 4 pm will be answered the following business day.   You may also send us  a message via MyChart. We typically respond to MyChart messages within 1-2 business days.  For prescription refills, please ask your pharmacy to contact our office. Our fax number is [phone].  If you have an urgent issue when the clinic is closed that cannot wait until the next business day, you can page your doctor at the number below.    Please note that while we do our best to be available for urgent issues outside of office hours, we are not available 24/7.   If you have an urgent issue and are unable to reach us , you may choose to seek medical  care at your doctor's office, retail clinic, urgent care center, or emergency room.  If you have a medical emergency, please immediately call 911 or go to the emergency department.  Pager Numbers  - Dr. Hester: [phone]  - Dr. Jackquline: [phone]  - Dr. Claudene: [phone]   In the event of inclement weather, please call our main line at [phone] for an update on the status of any delays or closures.  Dermatology Medication Tips: Please keep the boxes that topical medications come in in order to help keep track of the instructions about where and how to use these. Pharmacies typically print the medication instructions only on the boxes and not directly on the medication tubes.   If your medication is too expensive, please contact our office at [phone] option 4 or send us  a message through MyChart.   We are  unable to tell what your co-pay for medications will be in advance as this is different depending on your insurance coverage. However, we may be able to find a substitute medication at lower cost or fill out paperwork to get insurance to cover a needed medication.   If a prior authorization is required to get your medication covered by your insurance company, please allow us  1-2 business days to complete this process.  Drug prices often vary depending on where the prescription is filled and some pharmacies may offer cheaper prices.  The website www.goodrx.com contains coupons for medications through different pharmacies. The prices here do not account for what the cost may be with help from insurance (it may be cheaper with your insurance), but the website can give you the price if you did not use any insurance.  - You can print the associated coupon and take it with your prescription to the pharmacy.  - You may also stop by our office during regular business hours and pick up a GoodRx coupon card.  - If you need your prescription sent electronically to a different pharmacy, notify our office through [facility] or by phone at [phone] option 4.     Si Usted Necesita Algo Despus de Su Visita  Tambin puede enviarnos un mensaje a travs de Clinical Cytogeneticist. Por lo general respondemos a los mensajes de MyChart en el transcurso de 1 a 2 das hbiles.  Para renovar recetas, por favor pida a su farmacia que se ponga en contacto con nuestra oficina. Randi lakes de fax es [place] [phone].  Si tiene un asunto urgente cuando la clnica est cerrada y que no puede esperar hasta el siguiente da hbil, puede llamar/localizar a su doctor(a) al nmero que aparece a continuacin.   Por favor, tenga en cuenta que aunque hacemos todo lo posible para estar disponibles para asuntos urgentes fuera del horario de [place], no estamos disponibles las 24 horas del da, los 7 [address]  [address] de la [place].   Si tiene un  problema urgente y no puede comunicarse con nosotros, puede optar por buscar atencin mdica  en el consultorio de su doctor(a), en una clnica privada, en un centro de atencin urgente o en una sala de emergencias.  Si tiene engineer, drilling, por favor llame inmediatamente al 911 o vaya a la sala de emergencias.  Nmeros de bper  - Dr. Hester: [phone]  - Dra. Jackquline: [phone]  - Dr. Claudene: [phone]   En caso de inclemencias del tiempo, por favor llame a landry capes principal al [phone] para una actualizacin sobre el [place] de cualquier retraso o cierre.  Consejos para  la medicacin en dermatologa: Por favor, guarde las cajas en las que vienen los medicamentos de uso tpico para ayudarle a seguir las instrucciones sobre dnde y cmo usarlos. Las farmacias generalmente imprimen las instrucciones del medicamento slo en las cajas y no directamente en los tubos del [place].   Si su medicamento es muy caro, por favor, pngase en contacto con landry rieger llamando al [phone] y presione la opcin 4 o envenos un mensaje a travs de Clinical Cytogeneticist.   No podemos decirle cul ser su copago por los medicamentos por adelantado ya que esto es diferente dependiendo de la cobertura de su seguro. Sin embargo, es posible que podamos encontrar un medicamento sustituto a audiological scientist un formulario para que el seguro cubra el medicamento que se considera necesario.   Si se requiere una autorizacin previa para que su compaa de seguros cubra su medicamento, por favor permtanos de 1 a 2 das hbiles para completar este proceso.  Los precios de los medicamentos varan con frecuencia dependiendo del environmental consultant de dnde se surte la receta y alguna farmacias pueden ofrecer precios ms baratos.  El sitio web www.goodrx.com tiene cupones para medicamentos de health and safety inspector. Los precios aqu no tienen en cuenta lo que podra costar con la ayuda del seguro (puede ser ms  barato con su seguro), pero el sitio web puede darle el precio si no utiliz tourist information centre manager.  - Puede imprimir el cupn correspondiente y llevarlo con su receta a la farmacia.  - Tambin puede pasar por nuestra oficina durante el horario de atencin regular y education officer, museum una tarjeta de cupones de GoodRx.  - Si necesita que su receta se enve electrnicamente a una farmacia diferente, informe a nuestra oficina a travs de MyChart de [place] o por telfono llamando al [phone] y presione la opcin 4.

## 2024-01-08 ENCOUNTER — Ambulatory Visit: Payer: Self-pay | Admitting: Dermatology

## 2024-03-02 ENCOUNTER — Other Ambulatory Visit: Payer: Self-pay | Admitting: Internal Medicine

## 2024-03-02 DIAGNOSIS — Z1231 Encounter for screening mammogram for malignant neoplasm of breast: Secondary | ICD-10-CM

## 2024-03-05 IMAGING — MG MM DIGITAL SCREENING BILAT W/ TOMO AND CAD
8 series · 9 of 24 positions shown · non-contrast
Comparison: Previous exam(s).

CLINICAL DATA: Screening.

EXAM:
DIGITAL SCREENING BILATERAL MAMMOGRAM WITH TOMOSYNTHESIS AND CAD
TECHNIQUE: Bilateral screening digital craniocaudal and mediolateral oblique
mammograms were obtained. Bilateral screening digital breast
tomosynthesis was performed. The images were evaluated with
computer-aided detection.

[R CC synth-2D]
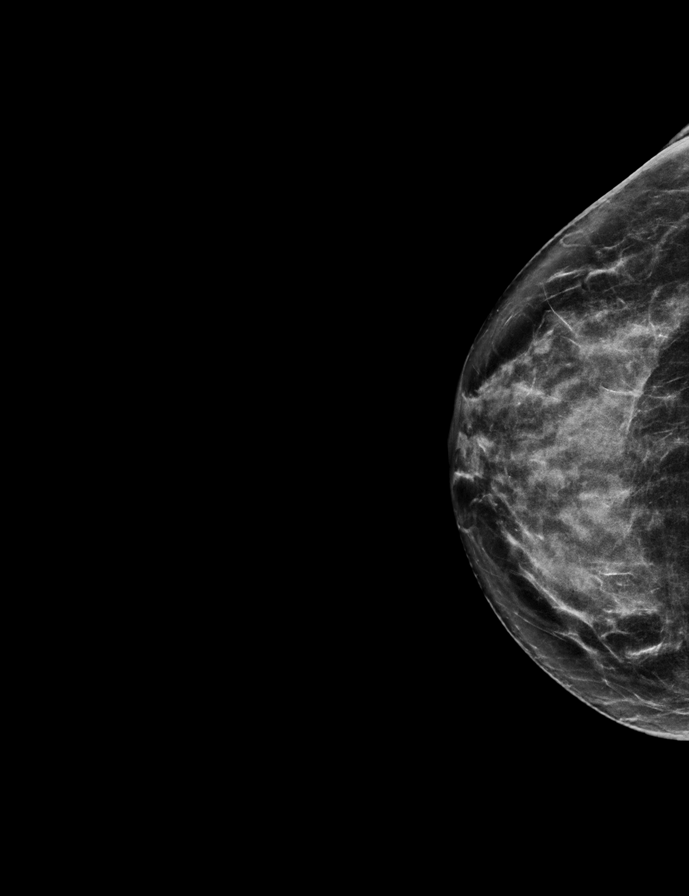

[R MLO synth-2D]
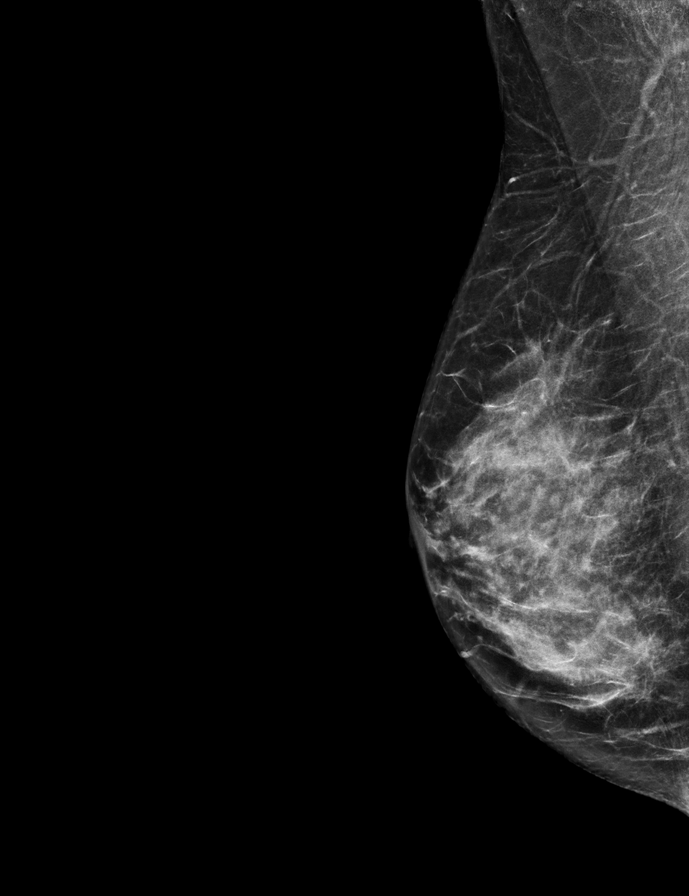

[L MLO synth-2D]
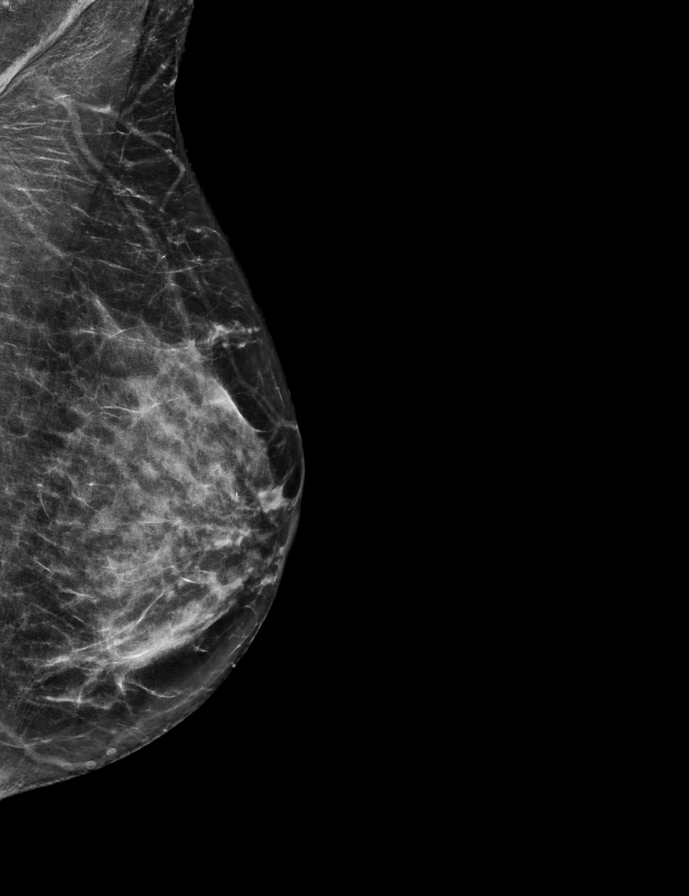

[L CC synth-2D]
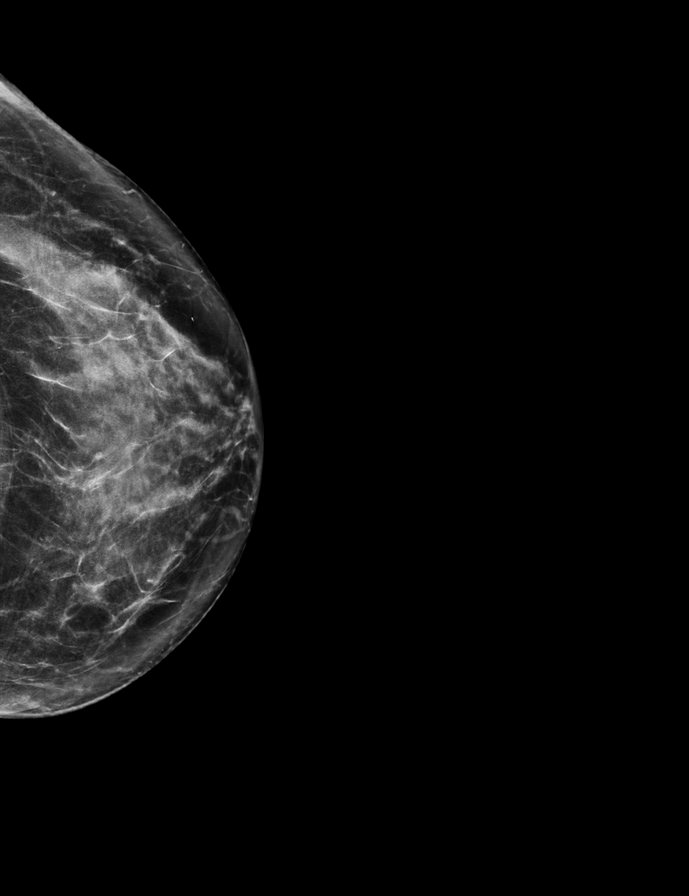

[L MLO tomo · 2 of 57 frames shown]
[frame 19/57]
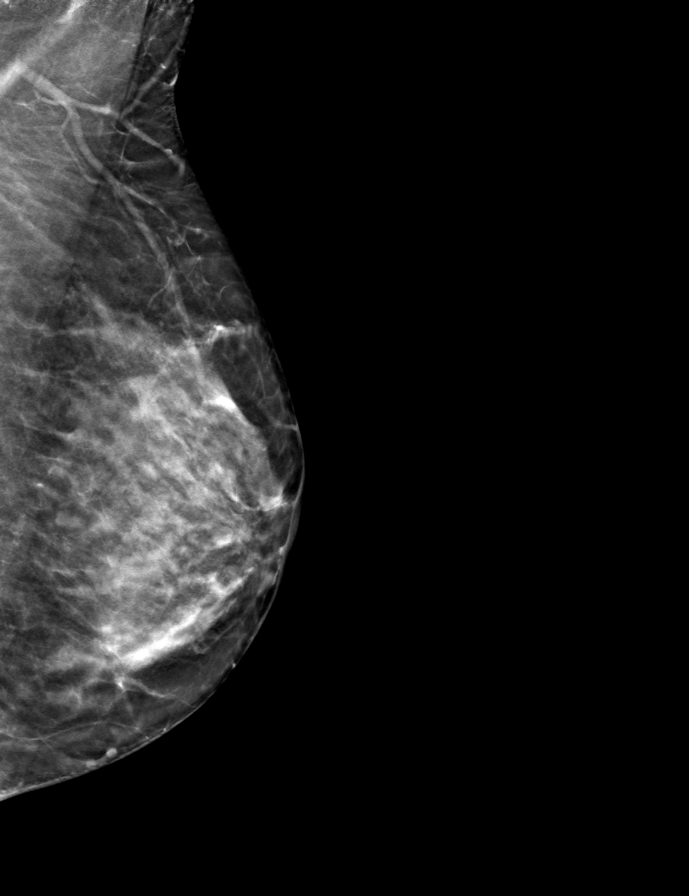
[frame 29/57]
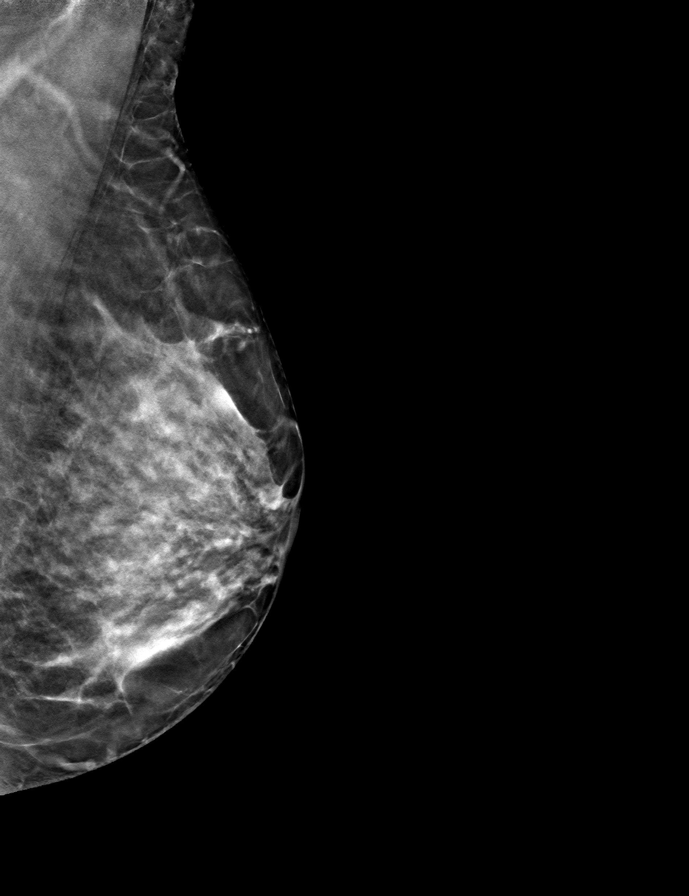

[R CC tomo · tomo slice 31/62.0]
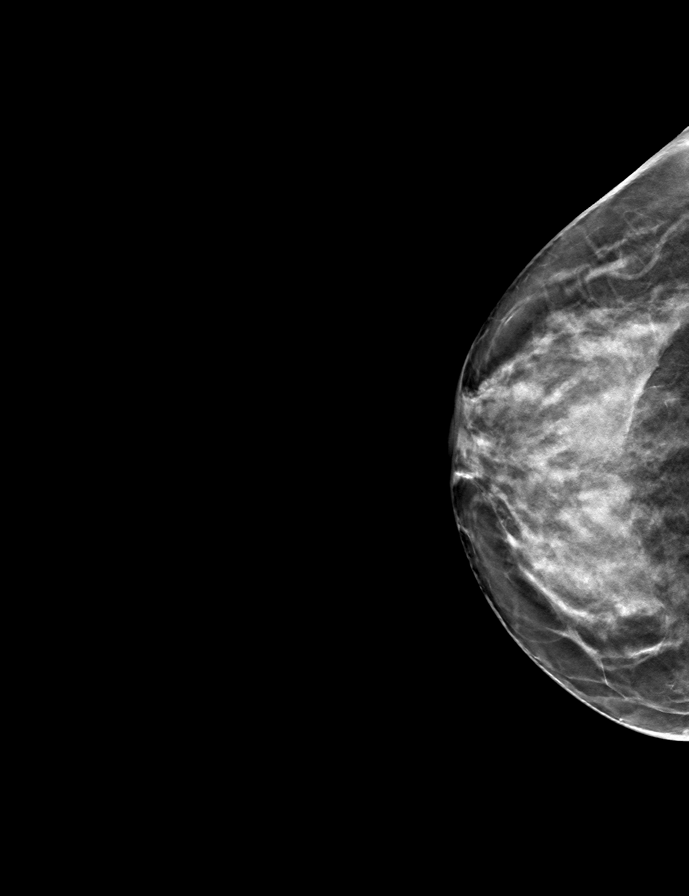

[R MLO tomo · tomo slice 30/59.0]
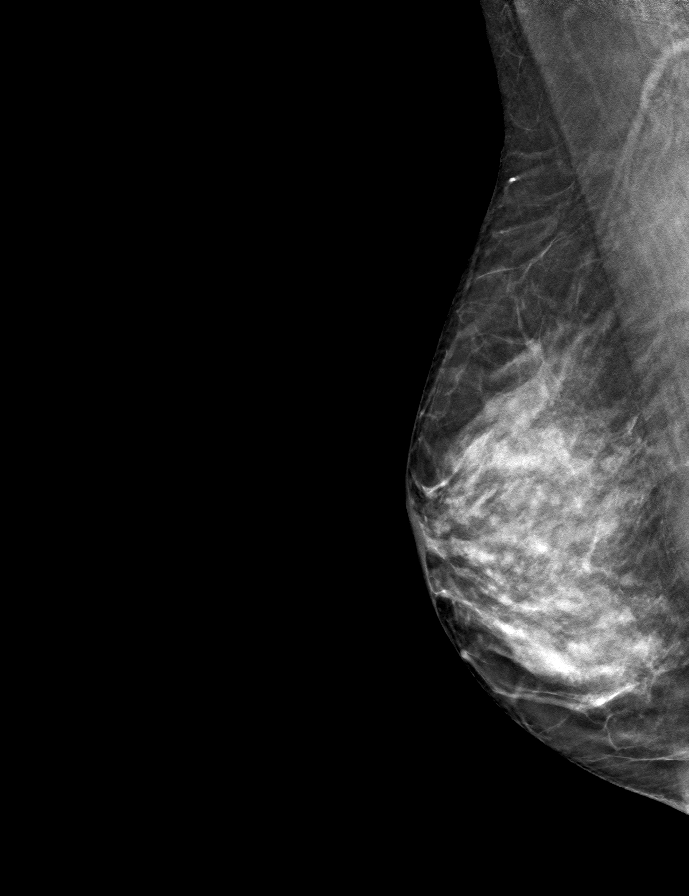

[L CC tomo · tomo slice 33/64.0]
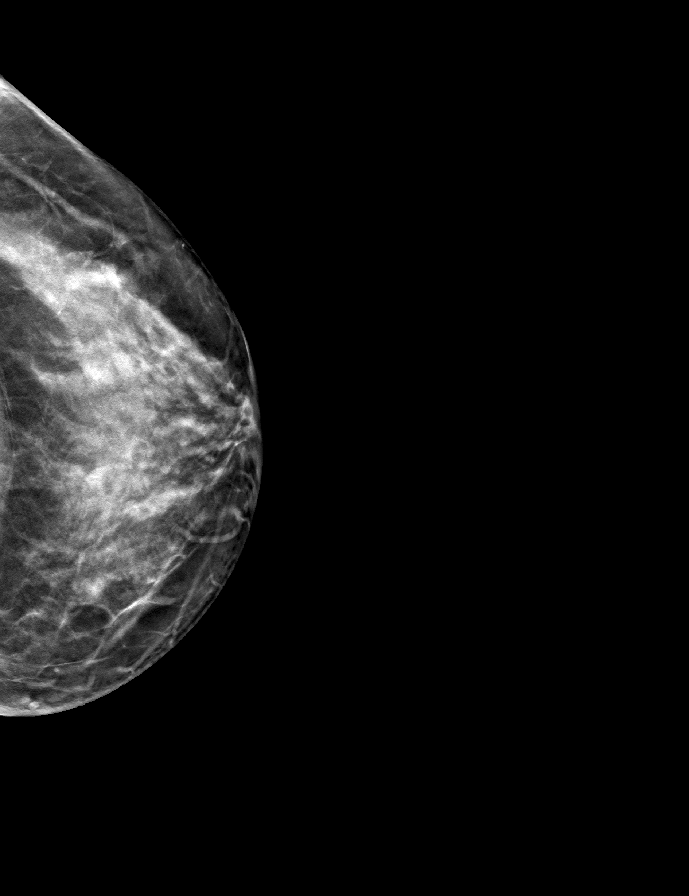

[9 of 24 positions shown; findings below may reference images not displayed]

ACR Breast Density Category c: The breast tissue is heterogeneously
dense, which may obscure small masses.
FINDINGS: There are no findings suspicious for malignancy.
IMPRESSION: No mammographic evidence of malignancy. A result letter of this
screening mammogram will be mailed directly to the patient.

RECOMMENDATION:
Screening mammogram in one year. (Code:[id number]-BC3)

BI-RADS CATEGORY  1: Negative.

## 2024-03-09 ENCOUNTER — Ambulatory Visit: Payer: Medicare Other | Admitting: Dermatology

## 2024-04-12 ENCOUNTER — Encounter

## 2024-04-19 ENCOUNTER — Ambulatory Visit
Admission: RE | Admit: 2024-04-19 | Discharge: 2024-04-19 | Disposition: A | Discharge status: Home / Self Care | Source: Ambulatory Visit | Attending: Internal Medicine | Admitting: Internal Medicine

## 2024-04-19 DIAGNOSIS — Z1231 Encounter for screening mammogram for malignant neoplasm of breast: Secondary | ICD-10-CM | POA: Insufficient documentation
